# Patient Record
Sex: Female | Born: 1975 | Race: White | Hispanic: No | Marital: Married | Smoking: Former smoker
Health system: Southern US, Community
[De-identification: ages and names within clinical notes are randomized; demographics above are authoritative.]

## PROBLEM LIST (undated history)

## (undated) DIAGNOSIS — R87619 Unspecified abnormal cytological findings in specimens from cervix uteri: Secondary | ICD-10-CM

## (undated) DIAGNOSIS — IMO0002 Reserved for concepts with insufficient information to code with codable children: Secondary | ICD-10-CM

## (undated) HISTORY — PX: WISDOM TOOTH EXTRACTION: SHX21

## (undated) HISTORY — DX: Unspecified abnormal cytological findings in specimens from cervix uteri: R87.619

## (undated) HISTORY — DX: Reserved for concepts with insufficient information to code with codable children: IMO0002

---

## 2000-02-11 ENCOUNTER — Other Ambulatory Visit: Admission: RE | Admit: 2000-02-11 | Discharge: 2000-02-11 | Payer: Self-pay | Admitting: Obstetrics

## 2011-12-02 ENCOUNTER — Ambulatory Visit (HOSPITAL_COMMUNITY)
Admission: RE | Admit: 2011-12-02 | Discharge: 2011-12-02 | Disposition: A | Discharge status: Home / Self Care | Payer: BC Managed Care – PPO | Source: Ambulatory Visit | Attending: Obstetrics | Admitting: Obstetrics

## 2011-12-02 NOTE — Progress Notes (Addendum)
Infant Lactation Consultation Outpatient Visit Note  Patient Name: Sabrina Meyers & [name] Date of Birth: [birth year]  Baby born :  [birth year] Birth Weight:  6-3  At 2 weeks at [facility] wt was 6-12  At consult at [facility] on 11/14/11 weight was 6-15 Gestational Age at Delivery: 39 weeks Type of Delivery: Cesarean Section  Breastfeeding History Frequency of Breastfeeding: During day have to wake by 3 hours and wakes up fussy every 4 hours during night Length of Feeding:  10 to 30 minutes ( if poor latch and 30 if good latch)  Voids: 6 plus  Stools: 8 plus yellow   Supplementing / Method: Pumping:  Type of Pump:  Has not been pumping   Frequency:  Volume:    Comments:    Consultation Evaluation:  Mom has thrush which is being treated with Diflucan & Nystatin.  Nipples and areola are red.  Mom says no longer having severe pain with latch, but sometimes [name] does not latch well or maintain the latch on the left breast.  Mom is leaking as soon as [name] starts crying.  She latches well and begins good rhythmic sucking and swallowing.  In less than 5 minutes into the feeding on the left breast, she starts arching, gagging and choking.  That's when she starts sucking in lower lip and loosing good suction and seal on the breast.  Mom concerned because [name] is only staying on the breast for 10 minutes and then wanting to BF again a few minutes later.  She is very gassy and fussy for long periods during the day and it seems like all night last night.  Sometimes the stools are green.      Initial Feeding Assessment:  Nude except for dry pamper Pre-feed Weight  :3814gms (8-6.5)   Post-feed Weight:  3889gms (8-9.1) Amount Transferred: 75ml Comments:    Additional Feeding Assessment:  Fully clothed Pre-feed Weight:  3949 Post-feed Weight: 3954  Amount Transferred:  39ml Comments:  Additional Feeding Assessment: Pre-feed Weight: Post-feed Weight: Amount  Transferred: Comments:  Total Breast milk Transferred this Visit:   Total Supplement Given: None needed  Additional Interventions:  Problem seems to be overactive let down.  Will try to breastfeed on 1 breast at a feeding.  Will break suction when begins to arch & push away when milk lets down, take off & burp.  Put back to same breast letting her feed for at least 20 minutes assuring sufficient amount of hindmilk.  This should decrease the stimulation helping with overactive let down.  Once baby is latched on with good deep latch, lean back for rest of feeding to prevent milk shooting so forcefully when let down occurs.     Follow-Up:  Call if needed.  Will call with update Monday.      Louis Meckel 12/02/2011, 9:06 AM

## 2011-12-12 ENCOUNTER — Ambulatory Visit (HOSPITAL_COMMUNITY)
Admission: RE | Admit: 2011-12-12 | Discharge: 2011-12-12 | Disposition: A | Discharge status: Home / Self Care | Payer: BC Managed Care – PPO | Source: Ambulatory Visit | Attending: Obstetrics | Admitting: Obstetrics

## 2011-12-12 NOTE — Progress Notes (Addendum)
Adult Lactation Consultation Outpatient Visit Note  Patient Name: Sabrina Meyers  Infant- Nilsa Nutting Date of Birth: 1976-07-08               DOB-[birth year] Gestational Age at Delivery: 39 weeks Type of Delivery:  Cesarean  [name] comes in today with complaint of sore nipples with latch.  Baby is 37 weeks old.  [name] just complete her course of diflucan and baby nystatin suspension for yeast on her nipples.  She said the burning, stinging nipples are better, but she is still experiencing pain with latch.  She wanted help latching [name] more deeply.  Baby has gained 60.2 oz in 43 days.  Milk supply very good.  Mom fed baby in cross cradle, and football holds.  [name] grunts and moves around a lot even during feedings.  When baby pushes off the breast, I have Mom take baby completely off and re-latch.  Had [name] practice over and over again, latching baby.  She stated that she could really tell the difference when [name] was latched after she had opened widest.  She needs practice latching quickly.  Talked about baby's fussiness, and grunting.  Stools yellow, but not a lot of seeds per Mom.  Suggested she feed baby on the same breast for 2 feedings in a row (if within 2-3 hrs), or pre-pumping morning feeding when she is so full.  This would ensure more hind milk at a feeding which is less gas producing.  Also to try eliminating all dairy from her diet for a week.  Talked about growth spurts at 6 weeks, and the best way to handle it, is feed on breast whenever she gives a cue she is hungry.  Also told her about APNO by Dr. Marshell Levan.  (all purpose nipple ointment) Comfort Gels given To call us prn   Judee Clara 12/12/2011, 5:09 PM

## 2013-01-17 ENCOUNTER — Encounter: Payer: BC Managed Care – PPO | Admitting: Obstetrics & Gynecology

## 2013-01-23 ENCOUNTER — Ambulatory Visit (INDEPENDENT_AMBULATORY_CARE_PROVIDER_SITE_OTHER): Payer: BC Managed Care – PPO | Admitting: Advanced Practice Midwife

## 2013-01-23 ENCOUNTER — Encounter: Payer: Self-pay | Admitting: Advanced Practice Midwife

## 2013-01-23 VITALS — BP 115/67 | HR 55 | Resp 16 | Ht 62.25 in | Wt 119.0 lb

## 2013-01-23 DIAGNOSIS — Z01419 Encounter for gynecological examination (general) (routine) without abnormal findings: Secondary | ICD-10-CM

## 2013-01-23 NOTE — Patient Instructions (Signed)
Breast Self-Awareness  Practicing breast self-awareness may pick up problems early, prevent significant medical complications, and possibly save your life. By practicing breast self-awareness, you can become familiar with how your breasts look and feel and if your breasts are changing. This allows you to notice changes early. It can also offer you some reassurance that your breast health is good. One way to learn what is normal for your breasts and whether your breasts are changing is to do a breast self-exam.  If you find a lump or something that was not present in the past, it is best to contact your caregiver right away. Other findings that should be evaluated by your caregiver include nipple discharge, especially if it is bloody; skin changes or reddening; areas where the skin seems to be pulled in (retracted); or new lumps and bumps. Breast pain is seldom associated with cancer (malignancy), but should also be evaluated by a caregiver.  BREAST SELF-EXAM  The best time to examine your breasts is 5 7 days after your menstrual period is over. During menstruation, the breasts are lumpier, and it may be more difficult to pick up changes. If you do not menstruate, have reached menopause, or had your uterus removed (hysterectomy), you should examine your breasts at regular intervals, such as monthly. If you are breastfeeding, examine your breasts after a feeding or after using a breast pump. Breast implants do not decrease the risk for lumps or tumors, so continue to perform breast self-exams as recommended. Talk to your caregiver about how to determine the difference between the implant and breast tissue. Also, talk about the amount of pressure you should use during the exam. Over time, you will become more familiar with the variations of your breasts and more comfortable with the exam. A breast self-exam requires you to remove all your clothes above the waist.    Look at your breasts and nipples. Stand in front of  a mirror in a room with good lighting. With your hands on your hips, push your hands firmly downward. Look for a difference in shape, contour, and size from one breast to the other (asymmetry). Asymmetry includes puckers, dips, or bumps. Also, look for skin changes, such as reddened or scaly areas on the breasts. Look for nipple changes, such as discharge, dimpling, repositioning, or redness.   Carefully feel your breasts. This is best done either in the shower or tub while using soapy water or when flat on your back. Place the arm (on the side of the breast you are examining) above your head. Use the pads (not the fingertips) of your three middle fingers on your opposite hand to feel your breasts. Start in the underarm area and use  inch (2 cm) overlapping circles to feel your breast. Use 3 different levels of pressure (light, medium, and firm pressure) at each circle before moving to the next circle. The light pressure is needed to feel the tissue closest to the skin. The medium pressure will help to feel breast tissue a little deeper, while the firm pressure is needed to feel the tissue close to the ribs. Continue the overlapping circles, moving downward over the breast until you feel your ribs below your breast. Then, move one finger-width towards the center of the body. Continue to use the  inch (2 cm) overlapping circles to feel your breast as you move slowly up toward the collar bone (clavicle) near the base of the neck. Continue the up and down exam using all 3 pressures   until you reach the middle of the chest. Do this with each breast, carefully feeling for lumps or changes.   Keep a written record with breast changes or normal findings for each breast. By writing this information down, you do not need to depend only on memory for size, tenderness, or location. Write down where you are in your menstrual cycle, if you are still menstruating.   Breast tissue can have some lumps or thick tissue. However,  see your caregiver if you find anything that concerns you.   SEEK MEDICAL CARE IF:   You see a change in shape, contour, or size of your breasts or nipples.    You see skin changes, such as reddened or scaly areas on the breasts or nipples.    You have an unusual discharge from your nipples.    You feel a new lump or unusually thick areas.   Document Released: 09/12/2005 Document Revised: 03/13/2012 Document Reviewed: 12/28/2011  Exit[employer] Patient Information 2013 [employer].

## 2013-01-23 NOTE — Progress Notes (Signed)
Subjective:     Sabrina Meyers is a 37 y.o. female here for a routine exam.  Current complaints: none.  Personal health questionnaire reviewed: yes.   Gynecologic History Patient's last menstrual period was 01/17/2013. Contraception: IUD and paragard Last Pap: 2013. Results were: normal, abnormal pap 10+ years ago, all normal since Last mammogram: n/a.   Obstetric History OB History   Grav Para Term Preterm Abortions TAB SAB Ect Mult Living   2 2 2       2      # Outc Date GA Lbr Len/2nd Wgt Sex Del Anes PTL Lv   1 TRM      CS      2 TRM      CS          The following portions of the patient's history were reviewed and updated as appropriate: allergies, current medications, past family history, past medical history, past social history, past surgical history and problem list.  Review of Systems A comprehensive review of systems was negative.    Objective:    BP 115/67  Pulse 55  Resp 16  Ht 5' 2.25" (1.581 m)  Wt 119 lb (53.978 kg)  BMI 21.59 kg/m2  LMP 01/17/2013  General Appearance:    Alert, cooperative, no distress, appears stated age  Head:    Normocephalic, without obvious abnormality, atraumatic              Neck:   Supple, symmetrical, trachea midline, no adenopathy;    thyroid:  no enlargement/tenderness/nodules; no carotid   bruit or JVD        Chest Wall:    No tenderness or deformity     Breast Exam:    No tenderness, masses, or nipple abnormality  Abdomen:     Soft, non-tender, bowel sounds active all four quadrants,    no masses, no organomegaly  Genitalia:    Normal female without lesion, discharge or tenderness     Extremities:   Extremities normal, atraumatic, no cyanosis or edema     Skin:   Skin color, texture, turgor normal, no rashes or lesions  Neuro: A&O x 4         Assessment:    Healthy female exam.    Plan:    Education reviewed: self breast exams. Contraception: IUD. Follow up in: 1 year.   Pap up to date - next due  2016

## 2013-08-01 ENCOUNTER — Other Ambulatory Visit: Payer: Self-pay

## 2013-08-27 ENCOUNTER — Ambulatory Visit: Payer: BC Managed Care – PPO | Admitting: Obstetrics & Gynecology

## 2013-08-28 ENCOUNTER — Ambulatory Visit (INDEPENDENT_AMBULATORY_CARE_PROVIDER_SITE_OTHER): Payer: BC Managed Care – PPO | Admitting: Obstetrics & Gynecology

## 2013-08-28 ENCOUNTER — Encounter: Payer: Self-pay | Admitting: Obstetrics & Gynecology

## 2013-08-28 VITALS — BP 131/65 | HR 59 | Resp 16 | Ht 62.5 in | Wt 126.0 lb

## 2013-08-28 DIAGNOSIS — Z30432 Encounter for removal of intrauterine contraceptive device: Secondary | ICD-10-CM

## 2013-08-28 DIAGNOSIS — Z3169 Encounter for other general counseling and advice on procreation: Secondary | ICD-10-CM

## 2013-08-28 NOTE — Progress Notes (Signed)
   Subjective:    Patient ID: Sabrina Meyers, female    DOB: Nov 21, 1975, 37 y.o.   MRN: [id number]  HPI  Sabrina Meyers is a 37 yo MW P2 who is here to have her 37 yo Copper T IUD removed because she would like to be pregnant. She is taking PNVs and denies a FH of MR, birth defects, CF or other hereditary issues.  Review of Systems She has had 2 previous cesarean sections and would like to have a TOLAC. I have discussed the risks and said that the decision is hers.    Objective:   Physical Exam  Her Copper T IUD was easily removed and noted to be intact.      Assessment & Plan:   Desire for pregnancy- All questions answered. RTC prn

## 2013-09-26 NOTE — L&D Delivery Note (Signed)
Attestation of Attending Supervision of Advanced Practitioner (CNM/NP): Evaluation and management procedures were performed by the Advanced Practitioner under my supervision and collaboration.  I have reviewed the Advanced Practitioner's note and chart, and I agree with the management and plan.  Lucilla Petrenko 06/29/2014 9:20 AM   

## 2013-09-26 NOTE — L&D Delivery Note (Signed)
Delivery Note At 3:24 PM a viable and healthy female was delivered via Vaginal, Spontaneous Delivery (Presentation: Right Occiput Anterior).  Loose nuchal reduced easily.  APGAR: 8, 9; weight pending.   Placenta status: Intact, Spontaneous.  Cord: 3 vessels with the following complications: None.  Cord pH: n/a  Anesthesia: Epidural  Episiotomy: None Lacerations: 2nd degree (Dr. Jolayne Pantheronstant in to assess laceration); capusle intact Suture Repair: 3 monocryl x 3; repaired right sulcus and 2nd degree perineal laceration. Est. Blood Loss (mL): 350  Mom to postpartum.  Baby to Couplet care / Skin to Skin.  Rochele PagesKARIM, WALIDAH N 06/24/2014, 4:29 PM

## 2013-10-28 ENCOUNTER — Encounter: Payer: Self-pay | Admitting: Advanced Practice Midwife

## 2013-10-28 ENCOUNTER — Ambulatory Visit (INDEPENDENT_AMBULATORY_CARE_PROVIDER_SITE_OTHER): Payer: 59 | Admitting: Advanced Practice Midwife

## 2013-10-28 VITALS — BP 119/70 | Wt 121.0 lb

## 2013-10-28 DIAGNOSIS — Z9889 Other specified postprocedural states: Secondary | ICD-10-CM

## 2013-10-28 DIAGNOSIS — O480 Post-term pregnancy: Secondary | ICD-10-CM | POA: Insufficient documentation

## 2013-10-28 DIAGNOSIS — O09529 Supervision of elderly multigravida, unspecified trimester: Secondary | ICD-10-CM | POA: Insufficient documentation

## 2013-10-28 DIAGNOSIS — Z98891 History of uterine scar from previous surgery: Secondary | ICD-10-CM | POA: Insufficient documentation

## 2013-10-28 DIAGNOSIS — O219 Vomiting of pregnancy, unspecified: Secondary | ICD-10-CM

## 2013-10-28 DIAGNOSIS — O26859 Spotting complicating pregnancy, unspecified trimester: Secondary | ICD-10-CM | POA: Insufficient documentation

## 2013-10-28 DIAGNOSIS — Z3491 Encounter for supervision of normal pregnancy, unspecified, first trimester: Secondary | ICD-10-CM

## 2013-10-28 DIAGNOSIS — Z3480 Encounter for supervision of other normal pregnancy, unspecified trimester: Secondary | ICD-10-CM

## 2013-10-28 LAB — HIV ANTIBODY (ROUTINE TESTING W REFLEX): HIV: NONREACTIVE

## 2013-10-28 MED ORDER — DOXYLAMINE-PYRIDOXINE 10-10 MG PO TBEC: 1.0000 | DELAYED_RELEASE_TABLET | ORAL | Status: DC

## 2013-10-28 NOTE — Progress Notes (Signed)
p-79  Last pap post partum   Small amount of spotting over the weekend with no further c/o's

## 2013-10-28 NOTE — Progress Notes (Signed)
  Subjective:    Sabrina Meyers is being seen today for her first obstetrical visit.  This is a planned pregnancy. She is at 4530w3d gestation. Her obstetrical history is significant for advanced maternal age and previous CS x 2. FTP, repeat. . Relationship with FOB: spouse, living together. Patient does intend to breast feed. Pregnancy history fully reviewed.     Review of Systems:   Review of Systems: Patient reports spotting 2 days ago. None now. No recent IC. No abd pain or passage of tissue. Nausea, rare vomiting. Otherwise negative.  Objective:     BP 119/70  Wt 121 lb (54.885 kg)  LMP 08/30/2013 Physical Exam  Exam Physical Examination: General appearance - alert, well appearing, and in no distress, oriented to person, place, and time and well hydrated Mental status - alert, oriented to person, place, and time, normal mood, behavior, speech, dress, motor activity, and thought processes Eyes - sclera anicteric Mouth - mucous membranes moist, pharynx normal without lesions and dental hygiene good Neck - supple, no significant adenopathy, thyroid exam: thyroid is normal in size without nodules or tenderness Heart - normal rate, regular rhythm, normal S1, S2, no murmurs, rubs, clicks or gallops Abdomen - soft, nontender, nondistended, no masses or organomegaly. Well-healed LTC/S scar Breasts - breasts appear normal, no suspicious masses, no skin or nipple changes or axillary nodes Pelvic -  Deferred. Will send to [facility]U.  Back exam - no CVAT Neurological - alert, oriented, normal speech, no focal findings or movement disorder noted Musculoskeletal - no joint tenderness, deformity or swelling Extremities - no pedal edema noted. Skin - normal coloration and turgor, no rashes, no suspicious skin lesions noted Positive cardiac activity per informal BS US.     Assessment:    Pregnancy: [id number] Patient Active Problem List   Diagnosis Date Noted  . History of cesarean section, low  transverse 10/28/2013  Supervision of normal pregnancy. First trimester spotting  Rh neg   Plan:    To MAU for bleeding eval, rhophylac.  Initial labs drawn. Prenatal vitamins. Problem list reviewed and updated. AFP3 discussed: declined. Role of ultrasound in pregnancy discussed; fetal survey: requested. Amniocentesis discussed: declined. Follow up in 4 weeks. 75% of 30 min visit spent on counseling and coordination of care.  Desires TOLAC. Increased risk of uterine rupture discussed. Will not induce. Low threshold for repeat C/S Pt verbalizes understanding and is agreeable.    Dorathy KinsmanSMITH, Sabrina Meyers, CNM 10/28/2013

## 2013-10-28 NOTE — Patient Instructions (Signed)
Vaginal Birth After Cesarean Delivery Vaginal birth after cesarean delivery (VBAC) is giving birth vaginally after previously delivering a baby by a cesarean. In the past, if a woman had a cesarean delivery, all births afterwards would be done by cesarean delivery. This is no longer true. It can be safe for the mother to try a vaginal delivery after having a cesarean delivery.  It is important to discuss VBAC with your health care provider early in the pregnancy so you can understand the risks, benefits, and options. It will give you time to decide what is best in your particular case. The final decision about whether to have a VBAC or repeat cesarean delivery should be between you and your health care provider. Any changes in your health or your baby's health during your pregnancy may make it necessary to change your initial decision about VBAC.  WOMEN WHO PLAN TO HAVE A VBAC SHOULD CHECK WITH THEIR HEALTH CARE PROVIDER TO BE SURE THAT:  The previous cesarean delivery was done with a low transverse uterine cut (incision) (not a vertical classical incision).   The birth canal is big enough for the baby.   There were no other operations on the uterus.   An electronic fetal monitor (EFM) will be on at all times during labor.   An operating room will be available and ready in case an emergency cesarean delivery is needed.   A health care provider and surgical nursing staff will be available at all times during labor to be ready to do an emergency delivery cesarean if necessary.   An anesthesiologist will be present in case an emergency cesarean delivery is needed.   The nursery is prepared and has adequate personnel and necessary equipment available to care for the baby in case of an emergency cesarean delivery. BENEFITS OF VBAC  Shorter stay in the hospital.   Avoidance of risks associated with cesarean delivery, such as:  Surgical complications, such as opening of the incision or  hernia in the incision.  Injury to other organs.  Fever. This can occur if an infection develops after surgery. It can also occur as a reaction to the medicine given to make you numb during the surgery.  Less blood loss and need for blood transfusions.  Lower risk of blood clots and infection.  Shorter recovery.   Decreased risk for having to remove the uterus (hysterectomy).   Decreased risk for the placenta to completely or partially cover the opening of the uterus (placenta previa) with a future pregnancy.   Decrease risk in future labor and delivery. RISKS OF A VBAC  Tearing (rupture) of the uterus. This is occurs in less than 1% of VBACs. The risk of this happening is higher if:  Steps are taken to begin the labor process (induce labor) or stimulate or strengthen contractions (augment labor).   Medicine is used to soften (ripen) the cervix.  Having to remove the uterus (hysterectomy) if it ruptures. VBAC SHOULD NOT BE DONE IF:  The previous cesarean delivery was done with a vertical (classical) or T-shaped incision or you do not know what kind of incision was made.   You had a ruptured uterus.   You have had certain types of surgery on your uterus, such as removal of uterine fibroids. Ask your health care provider about other types of surgeries that prevent you from having a VBAC.  You have certain medical or childbirth (obstetrical) problems.   There are problems with the baby.   You   have had two previous cesarean deliveries and no vaginal deliveries. OTHER FACTS TO KNOW ABOUT VBAC:  It is safe to have an epidural anesthetic with VBAC.   It is safe to turn the baby from a breech position (attempt an external cephalic version).   It is safe to try a VBAC with twins.   VBAC may not be successful if your baby weights 8.8 lb (4 kg) or more. However, weight predictions are not always accurate and should not be used alone to decide if VBAC is right for  you.  There is an increased failure rate if the time between the cesarean delivery and VBAC is less than 19 months.   Your health care provider may advise against a VBAC if you have preeclampsia (high blood pressure, protein in the urine, and swelling of face and extremities).   VBAC is often successful if you previously gave birth vaginally.   VBAC is often successful when the labor starts spontaneously before the due date.   Delivering a baby through a VBAC is similar to having a normal spontaneous vaginal delivery. Document Released: 03/05/2007 Document Revised: 07/03/2013 Document Reviewed: 04/11/2013 [facility] Patient Information 2014 [place], [place].   Vaginal Bleeding During Pregnancy, First Trimester A small amount of bleeding (spotting) from the vagina is relatively common in early pregnancy. It usually stops on its own. Various things may cause bleeding or spotting in early pregnancy. Some bleeding may be related to the pregnancy, and some may not. In most cases, the bleeding is normal and is not a problem. However, bleeding can also be a sign of something serious. Be sure to tell your health care provider about any vaginal bleeding right away. Some possible causes of vaginal bleeding during the first trimester include:  Infection or inflammation of the cervix.  Growths (polyps) on the cervix.  Miscarriage or threatened miscarriage.  Pregnancy tissue has developed outside of the uterus and in a fallopian tube (tubal pregnancy).  Tiny cysts have developed in the uterus instead of pregnancy tissue (molar pregnancy). HOME CARE INSTRUCTIONS  Watch your condition for any changes. The following actions may help to lessen any discomfort you are feeling:  Follow your health care provider's instructions for limiting your activity. If your health care provider orders bed rest, you may need to stay in bed and only get up to use the bathroom. However, your health care provider may  allow you to continue light activity.  If needed, make plans for someone to help with your regular activities and responsibilities while you are on bed rest.  Keep track of the number of pads you use each day, how often you change pads, and how soaked (saturated) they are. Write this down.  Do not use tampons. Do not douche.  Do not have sexual intercourse or orgasms until approved by your health care provider.  If you pass any tissue from your vagina, save the tissue so you can show it to your health care provider.  Only take over-the-counter or prescription medicines as directed by your health care provider.  Do not take aspirin because it can make you bleed.  Keep all follow-up appointments as directed by your health care provider. SEEK MEDICAL CARE IF:  You have any vaginal bleeding during any part of your pregnancy.  You have cramps or labor pains. SEEK IMMEDIATE MEDICAL CARE IF:   You have severe cramps in your back or belly (abdomen).  You have a fever, not controlled by medicine.  You pass large  clots or tissue from your vagina.  Your bleeding increases.  You feel lightheaded or weak, or you have fainting episodes.  You have chills.  You are leaking fluid or have a gush of fluid from your vagina.  You pass out while having a bowel movement. MAKE SURE YOU:  Understand these instructions.  Will watch your condition.  Will get help right away if you are not doing well or get worse. Document Released: 06/22/2005 Document Revised: 07/03/2013 Document Reviewed: 05/20/2013 [facility] Patient Information 2014 [place], [place].

## 2013-10-29 ENCOUNTER — Ambulatory Visit (INDEPENDENT_AMBULATORY_CARE_PROVIDER_SITE_OTHER): Payer: 59

## 2013-10-29 ENCOUNTER — Ambulatory Visit (INDEPENDENT_AMBULATORY_CARE_PROVIDER_SITE_OTHER): Payer: 59 | Admitting: *Deleted

## 2013-10-29 ENCOUNTER — Other Ambulatory Visit: Payer: 59

## 2013-10-29 ENCOUNTER — Other Ambulatory Visit: Payer: Self-pay | Admitting: *Deleted

## 2013-10-29 DIAGNOSIS — Z6791 Unspecified blood type, Rh negative: Secondary | ICD-10-CM

## 2013-10-29 DIAGNOSIS — Z331 Pregnant state, incidental: Secondary | ICD-10-CM

## 2013-10-29 DIAGNOSIS — O26859 Spotting complicating pregnancy, unspecified trimester: Secondary | ICD-10-CM

## 2013-10-29 DIAGNOSIS — O9989 Other specified diseases and conditions complicating pregnancy, childbirth and the puerperium: Secondary | ICD-10-CM

## 2013-10-29 DIAGNOSIS — R52 Pain, unspecified: Secondary | ICD-10-CM

## 2013-10-29 DIAGNOSIS — N949 Unspecified condition associated with female genital organs and menstrual cycle: Secondary | ICD-10-CM

## 2013-10-29 DIAGNOSIS — O361190 Maternal care for Anti-A sensitization, unspecified trimester, not applicable or unspecified: Secondary | ICD-10-CM

## 2013-10-29 DIAGNOSIS — O99891 Other specified diseases and conditions complicating pregnancy: Secondary | ICD-10-CM

## 2013-10-29 DIAGNOSIS — N898 Other specified noninflammatory disorders of vagina: Secondary | ICD-10-CM

## 2013-10-29 LAB — OBSTETRIC PANEL
ANTIBODY SCREEN: NEGATIVE
BASOS ABS: 0 10*3/uL (ref 0.0–0.1)
BASOS PCT: 1 % (ref 0–1)
EOS ABS: 0.1 10*3/uL (ref 0.0–0.7)
EOS PCT: 2 % (ref 0–5)
HEMATOCRIT: 37.6 % (ref 36.0–46.0)
HEMOGLOBIN: 13.1 g/dL (ref 12.0–15.0)
Hepatitis B Surface Ag: NEGATIVE
Lymphocytes Relative: 23 % (ref 12–46)
Lymphs Abs: 1.3 10*3/uL (ref 0.7–4.0)
MCH: 31.3 pg (ref 26.0–34.0)
MCHC: 34.8 g/dL (ref 30.0–36.0)
MCV: 90 fL (ref 78.0–100.0)
MONO ABS: 0.5 10*3/uL (ref 0.1–1.0)
MONOS PCT: 9 % (ref 3–12)
Neutro Abs: 3.9 10*3/uL (ref 1.7–7.7)
Neutrophils Relative %: 65 % (ref 43–77)
Platelets: 207 10*3/uL (ref 150–400)
RBC: 4.18 MIL/uL (ref 3.87–5.11)
RDW: 13 % (ref 11.5–15.5)
RH TYPE: NEGATIVE
Rubella: 1.23 Index — ABNORMAL HIGH (ref <0.90)
WBC: 5.9 10*3/uL (ref 4.0–10.5)

## 2013-10-29 LAB — GC/CHLAMYDIA PROBE AMP, URINE
Chlamydia, Swab/Urine, PCR: NEGATIVE
GC PROBE AMP, URINE: NEGATIVE

## 2013-10-29 MED ORDER — RHO D IMMUNE GLOBULIN 1500 UNIT/2ML IJ SOLN
300.0000 ug | Freq: Once | INTRAMUSCULAR | Status: AC
  Administered 2013-10-29: 300 ug via INTRAMUSCULAR

## 2013-10-30 LAB — CULTURE, OB URINE
COLONY COUNT: NO GROWTH
Organism ID, Bacteria: NO GROWTH

## 2013-10-31 ENCOUNTER — Encounter: Payer: Self-pay | Admitting: Advanced Practice Midwife

## 2013-10-31 DIAGNOSIS — O26891 Other specified pregnancy related conditions, first trimester: Secondary | ICD-10-CM | POA: Insufficient documentation

## 2013-10-31 DIAGNOSIS — Z6791 Unspecified blood type, Rh negative: Secondary | ICD-10-CM | POA: Insufficient documentation

## 2013-11-06 ENCOUNTER — Telehealth: Payer: Self-pay | Admitting: *Deleted

## 2013-11-06 DIAGNOSIS — O219 Vomiting of pregnancy, unspecified: Secondary | ICD-10-CM

## 2013-11-06 MED ORDER — PROMETHAZINE HCL 25 MG PO TABS: 25.0000 mg | ORAL_TABLET | Freq: Four times a day (QID) | ORAL | Status: DC | PRN

## 2013-11-06 NOTE — Telephone Encounter (Signed)
Pt's insurance declined coverage for Diglegis and pt requesting something else called in.  RX for Phenergan 25 mg sent to [facility] pharmacy.

## 2013-11-25 ENCOUNTER — Encounter: Payer: Self-pay | Admitting: Advanced Practice Midwife

## 2013-11-25 ENCOUNTER — Ambulatory Visit (INDEPENDENT_AMBULATORY_CARE_PROVIDER_SITE_OTHER): Payer: 59 | Admitting: Advanced Practice Midwife

## 2013-11-25 VITALS — BP 114/64 | Wt 125.0 lb

## 2013-11-25 DIAGNOSIS — Z3480 Encounter for supervision of other normal pregnancy, unspecified trimester: Secondary | ICD-10-CM

## 2013-11-25 DIAGNOSIS — Z9889 Other specified postprocedural states: Secondary | ICD-10-CM

## 2013-11-25 DIAGNOSIS — Z98891 History of uterine scar from previous surgery: Secondary | ICD-10-CM

## 2013-11-25 DIAGNOSIS — Z3491 Encounter for supervision of normal pregnancy, unspecified, first trimester: Secondary | ICD-10-CM

## 2013-11-25 NOTE — Patient Instructions (Addendum)
Advanced Maternal Age 36.60  Breastfeeding Deciding to breastfeed is one of the best choices you can make for you and your baby. A change in hormones during pregnancy causes your breast tissue to grow and increases the number and size of your milk ducts. These hormones also allow proteins, sugars, and fats from your blood supply to make breast milk in your milk-producing glands. Hormones prevent breast milk from being released before your baby is born as well as prompt milk flow after birth. Once breastfeeding has begun, thoughts of your baby, as well as his or her sucking or crying, can stimulate the release of milk from your milk-producing glands.  BENEFITS OF BREASTFEEDING For Your Baby  Your first milk (colostrum) helps your baby's digestive system function better.   There are antibodies in your milk that help your baby fight off infections.   Your baby has a lower incidence of asthma, allergies, and sudden infant death syndrome.   The nutrients in breast milk are better for your baby than infant formulas and are designed uniquely for your baby's needs.   Breast milk improves your baby's brain development.   Your baby is less likely to develop other conditions, such as childhood obesity, asthma, or type 2 diabetes mellitus.  For You   Breastfeeding helps to create a very special bond between you and your baby.   Breastfeeding is convenient. Breast milk is always available at the correct temperature and costs nothing.   Breastfeeding helps to burn calories and helps you lose the weight gained during pregnancy.   Breastfeeding makes your uterus contract to its prepregnancy size faster and slows bleeding (lochia) after you give birth.   Breastfeeding helps to lower your risk of developing type 2 diabetes mellitus, osteoporosis, and breast or ovarian cancer later in life. SIGNS THAT YOUR BABY IS HUNGRY Early Signs of Hunger  Increased alertness or  activity.  Stretching.  Movement of the head from side to side.  Movement of the head and opening of the mouth when the corner of the mouth or cheek is stroked (rooting).  Increased sucking sounds, smacking lips, cooing, sighing, or squeaking.  Hand-to-mouth movements.  Increased sucking of fingers or hands. Late Signs of Hunger  Fussing.  Intermittent crying. Extreme Signs of Hunger Signs of extreme hunger will require calming and consoling before your baby will be able to breastfeed successfully. Do not wait for the following signs of extreme hunger to occur before you initiate breastfeeding:   Restlessness.  A loud, strong cry.   Screaming. BREASTFEEDING BASICS Breastfeeding Initiation  Find a comfortable place to sit or lie down, with your neck and back well supported.  Place a pillow or rolled up blanket under your baby to bring him or her to the level of your breast (if you are seated). Nursing pillows are specially designed to help support your arms and your baby while you breastfeed.  Make sure that your baby's abdomen is facing your abdomen.   Gently massage your breast. With your fingertips, massage from your chest wall toward your nipple in a circular motion. This encourages milk flow. You may need to continue this action during the feeding if your milk flows slowly.  Support your breast with 4 fingers underneath and your thumb above your nipple. Make sure your fingers are well away from your nipple and your baby's mouth.   Stroke your baby's lips gently with your finger or nipple.   When your baby's mouth is open wide enough, quickly  bring your baby to your breast, placing your entire nipple and as much of the colored area around your nipple (areola) as possible into your baby's mouth.   More areola should be visible above your baby's upper lip than below the lower lip.   Your baby's tongue should be between his or her lower gum and your breast.    Ensure that your baby's mouth is correctly positioned around your nipple (latched). Your baby's lips should create a seal on your breast and be turned out (everted).  It is common for your baby to suck about 2 3 minutes in order to start the flow of breast milk. Latching Teaching your baby how to latch on to your breast properly is very important. An improper latch can cause nipple pain and decreased milk supply for you and poor weight gain in your baby. Also, if your baby is not latched onto your nipple properly, he or she may swallow some air during feeding. This can make your baby fussy. Burping your baby when you switch breasts during the feeding can help to get rid of the air. However, teaching your baby to latch on properly is still the best way to prevent fussiness from swallowing air while breastfeeding. Signs that your baby has successfully latched on to your nipple:    Silent tugging or silent sucking, without causing you pain.   Swallowing heard between every 3 4 sucks.    Muscle movement above and in front of his or her ears while sucking.  Signs that your baby has not successfully latched on to nipple:   Sucking sounds or smacking sounds from your baby while breastfeeding.  Nipple pain. If you think your baby has not latched on correctly, slip your finger into the corner of your baby's mouth to break the suction and place it between your baby's gums. Attempt breastfeeding initiation again. Signs of Successful Breastfeeding Signs from your baby:   A gradual decrease in the number of sucks or complete cessation of sucking.   Falling asleep.   Relaxation of his or her body.   Retention of a small amount of milk in his or her mouth.   Letting go of your breast by himself or herself. Signs from you:  Breasts that have increased in firmness, weight, and size 1 3 hours after feeding.   Breasts that are softer immediately after breastfeeding.  Increased milk  volume, as well as a change in milk consistency and color by the 5th day of breastfeeding.   Nipples that are not sore, cracked, or bleeding. Signs That Your Pecola LeisureBaby is Getting Enough Milk  Wetting at least 3 diapers in a 24-hour period. The urine should be clear and pale yellow by age 26 days.  At least 3 stools in a 24-hour period by age 26 days. The stool should be soft and yellow.  At least 3 stools in a 24-hour period by age 39 days. The stool should be seedy and yellow.  No loss of weight greater than 10% of birth weight during the first 83 days of age.  Average weight gain of 4 7 ounces (120 210 mL) per week after age 2 days.  Consistent daily weight gain by age 26 days, without weight loss after the age of 2 weeks. After a feeding, your baby may spit up a small amount. This is common. BREASTFEEDING FREQUENCY AND DURATION Frequent feeding will help you make more milk and can prevent sore nipples and breast engorgement. Breastfeed when you  feel the need to reduce the fullness of your breasts or when your baby shows signs of hunger. This is called "breastfeeding on demand." Avoid introducing a pacifier to your baby while you are working to establish breastfeeding (the first 4 6 weeks after your baby is born). After this time you may choose to use a pacifier. Research has shown that pacifier use during the first year of a baby's life decreases the risk of sudden infant death syndrome (SIDS). Allow your baby to feed on each breast as long as he or she wants. Breastfeed until your baby is finished feeding. When your baby unlatches or falls asleep while feeding from the first breast, offer the second breast. Because newborns are often sleepy in the first few weeks of life, you may need to awaken your baby to get him or her to feed. Breastfeeding times will vary from baby to baby. However, the following rules can serve as a guide to help you ensure that your baby is properly fed:  Newborns (babies 58  weeks of age or younger) may breastfeed every 1 3 hours.  Newborns should not go longer than 3 hours during the day or 5 hours during the night without breastfeeding.  You should breastfeed your baby a minimum of 8 times in a 24-hour period until you begin to introduce solid foods to your baby at around 41 months of age. BREAST MILK PUMPING Pumping and storing breast milk allows you to ensure that your baby is exclusively fed your breast milk, even at times when you are unable to breastfeed. This is especially important if you are going back to work while you are still breastfeeding or when you are not able to be present during feedings. Your lactation consultant can give you guidelines on how long it is safe to store breast milk.  A breast pump is a machine that allows you to pump milk from your breast into a sterile bottle. The pumped breast milk can then be stored in a refrigerator or freezer. Some breast pumps are operated by hand, while others use electricity. Ask your lactation consultant which type will work best for you. Breast pumps can be purchased, but some hospitals and breastfeeding support groups lease breast pumps on a monthly basis. A lactation consultant can teach you how to hand express breast milk, if you prefer not to use a pump.  CARING FOR YOUR BREASTS WHILE YOU BREASTFEED Nipples can become dry, cracked, and sore while breastfeeding. The following recommendations can help keep your breasts moisturized and healthy:  Avoid using soap on your nipples.   Wear a supportive bra. Although not required, special nursing bras and tank tops are designed to allow access to your breasts for breastfeeding without taking off your entire bra or top. Avoid wearing underwire style bras or extremely tight bras.  Air dry your nipples for 3 after each feeding.   Use only cotton bra pads to absorb leaked breast milk. Leaking of breast milk between feedings is normal.   Use lanolin on  your nipples after breastfeeding. Lanolin helps to maintain your skin's normal moisture barrier. If you use pure lanolin you do not need to wash it off before feeding your baby again. Pure lanolin is not toxic to your baby. You may also hand express a few drops of breast milk and gently massage that milk into your nipples and allow the milk to air dry. In the first few weeks after giving birth, some women experience extremely full  breasts (engorgement). Engorgement can make your breasts feel heavy, warm, and tender to the touch. Engorgement peaks within 3 5 days after you give birth. The following recommendations can help ease engorgement:  Completely empty your breasts while breastfeeding or pumping. You may want to start by applying warm, moist heat (in the shower or with warm water-soaked hand towels) just before feeding or pumping. This increases circulation and helps the milk flow. If your baby does not completely empty your breasts while breastfeeding, pump any extra milk after he or she is finished.  Wear a snug bra (nursing or regular) or tank top for 1 2 days to signal your body to slightly decrease milk production.  Apply ice packs to your breasts, unless this is too uncomfortable for you.  Make sure that your baby is latched on and positioned properly while breastfeeding. If engorgement persists after 48 hours of following these recommendations, contact your health care provider or a Advertising copywriter. OVERALL HEALTH CARE RECOMMENDATIONS WHILE BREASTFEEDING  Eat healthy foods. Alternate between meals and snacks, eating 3 of each per day. Because what you eat affects your breast milk, some of the foods may make your baby more irritable than usual. Avoid eating these foods if you are sure that they are negatively affecting your baby.  Drink milk, fruit juice, and water to satisfy your thirst (about 10 glasses a day).   Rest often, relax, and continue to take your prenatal vitamins to  prevent fatigue, stress, and anemia.  Continue breast self-awareness checks.  Avoid chewing and smoking tobacco.  Avoid alcohol and drug use. Some medicines that may be harmful to your baby can pass through breast milk. It is important to ask your health care provider before taking any medicine, including all over-the-counter and prescription medicine as well as vitamin and herbal supplements. It is possible to become pregnant while breastfeeding. If birth control is desired, ask your health care provider about options that will be safe for your baby. SEEK MEDICAL CARE IF:   You feel like you want to stop breastfeeding or have become frustrated with breastfeeding.  You have painful breasts or nipples.  Your nipples are cracked or bleeding.  Your breasts are red, tender, or warm.  You have a swollen area on either breast.  You have a fever or chills.  You have nausea or vomiting.  You have drainage other than breast milk from your nipples.  Your breasts do not become full before feedings by the 5th day after you give birth.  You feel sad and depressed.  Your baby is too sleepy to eat well.  Your baby is having trouble sleeping.   Your baby is wetting less than 3 diapers in a 24-hour period.  Your baby has less than 3 stools in a 24-hour period.  Your baby's skin or the white part of his or her eyes becomes yellow.   Your baby is not gaining weight by 35 days of age. SEEK IMMEDIATE MEDICAL CARE IF:   Your baby is overly tired (lethargic) and does not want to wake up and feed.  Your baby develops an unexplained fever. Document Released: 09/12/2005 Document Revised: 05/15/2013 Document Reviewed: 03/06/2013 [facility] Patient Information 2014 [place], [place].

## 2013-11-25 NOTE — Progress Notes (Signed)
Reviewed labs. Spotting 1 week ago. None since. Formal US 2/3 showed 7-4 week IUP. Too late for rhophylac. Bleeding precautions. Call or come to [facility] for further bleeding. Considering Panorama. Will check w/ insurance.

## 2013-11-25 NOTE — Progress Notes (Signed)
p-95  Spotted once last week

## 2013-12-23 ENCOUNTER — Ambulatory Visit (INDEPENDENT_AMBULATORY_CARE_PROVIDER_SITE_OTHER): Payer: 59 | Admitting: Advanced Practice Midwife

## 2013-12-23 VITALS — BP 108/59 | Wt 132.0 lb

## 2013-12-23 DIAGNOSIS — Z3480 Encounter for supervision of other normal pregnancy, unspecified trimester: Secondary | ICD-10-CM

## 2013-12-23 NOTE — Progress Notes (Signed)
Doing well.  Denies vaginal bleeding, LOF, regular contractions.  No bleeding since first trimester.  No questions or concerns today.

## 2013-12-23 NOTE — Progress Notes (Signed)
p-69

## 2014-01-17 ENCOUNTER — Ambulatory Visit (HOSPITAL_COMMUNITY)
Admission: RE | Admit: 2014-01-17 | Discharge: 2014-01-17 | Disposition: A | Discharge status: Home / Self Care | Payer: 59 | Source: Ambulatory Visit | Attending: Advanced Practice Midwife | Admitting: Advanced Practice Midwife

## 2014-01-17 DIAGNOSIS — Z3480 Encounter for supervision of other normal pregnancy, unspecified trimester: Secondary | ICD-10-CM

## 2014-01-17 DIAGNOSIS — Z3689 Encounter for other specified antenatal screening: Secondary | ICD-10-CM | POA: Insufficient documentation

## 2014-01-17 DIAGNOSIS — Z3491 Encounter for supervision of normal pregnancy, unspecified, first trimester: Secondary | ICD-10-CM

## 2014-01-22 ENCOUNTER — Ambulatory Visit (INDEPENDENT_AMBULATORY_CARE_PROVIDER_SITE_OTHER): Payer: 59 | Admitting: Obstetrics & Gynecology

## 2014-01-22 VITALS — BP 106/60 | HR 66 | Wt 139.0 lb

## 2014-01-22 DIAGNOSIS — Z3491 Encounter for supervision of normal pregnancy, unspecified, first trimester: Secondary | ICD-10-CM

## 2014-01-22 DIAGNOSIS — O09529 Supervision of elderly multigravida, unspecified trimester: Secondary | ICD-10-CM

## 2014-01-22 DIAGNOSIS — Z3480 Encounter for supervision of other normal pregnancy, unspecified trimester: Secondary | ICD-10-CM

## 2014-01-22 NOTE — Progress Notes (Signed)
Routine visit. Flutters. No problems. She wants Panorama and it will be drawn today.

## 2014-01-29 ENCOUNTER — Encounter: Payer: Self-pay | Admitting: *Deleted

## 2014-02-21 ENCOUNTER — Ambulatory Visit (INDEPENDENT_AMBULATORY_CARE_PROVIDER_SITE_OTHER): Payer: 59 | Admitting: Obstetrics & Gynecology

## 2014-02-21 VITALS — BP 110/67 | HR 80 | Wt 144.0 lb

## 2014-02-21 DIAGNOSIS — Z3480 Encounter for supervision of other normal pregnancy, unspecified trimester: Secondary | ICD-10-CM

## 2014-02-21 DIAGNOSIS — Z3491 Encounter for supervision of normal pregnancy, unspecified, first trimester: Secondary | ICD-10-CM

## 2014-02-21 NOTE — Progress Notes (Signed)
Routine visit. Good FM. No problems. Glucola, labs, and tdap at next visit.

## 2014-03-20 ENCOUNTER — Ambulatory Visit (INDEPENDENT_AMBULATORY_CARE_PROVIDER_SITE_OTHER): Payer: 59 | Admitting: Obstetrics & Gynecology

## 2014-03-20 VITALS — BP 106/58 | HR 59 | Wt 148.0 lb

## 2014-03-20 DIAGNOSIS — O360131 Maternal care for anti-D [Rh] antibodies, third trimester, fetus 1: Secondary | ICD-10-CM

## 2014-03-20 DIAGNOSIS — O09529 Supervision of elderly multigravida, unspecified trimester: Secondary | ICD-10-CM

## 2014-03-20 DIAGNOSIS — Z3480 Encounter for supervision of other normal pregnancy, unspecified trimester: Secondary | ICD-10-CM

## 2014-03-20 DIAGNOSIS — Z23 Encounter for immunization: Secondary | ICD-10-CM

## 2014-03-20 DIAGNOSIS — O360990 Maternal care for other rhesus isoimmunization, unspecified trimester, not applicable or unspecified: Secondary | ICD-10-CM

## 2014-03-20 DIAGNOSIS — Z3491 Encounter for supervision of normal pregnancy, unspecified, first trimester: Secondary | ICD-10-CM

## 2014-03-20 DIAGNOSIS — O3090 Multiple gestation, unspecified, unspecified trimester: Secondary | ICD-10-CM

## 2014-03-20 MED ORDER — DTAP-HEPATITIS B RECOMB-IPV IM SUSP: 0.5000 mL | Freq: Once | INTRAMUSCULAR | Status: DC

## 2014-03-20 MED ORDER — TETANUS-DIPHTH-ACELL PERTUSSIS 5-2.5-18.5 LF-MCG/0.5 IM SUSP
0.5000 mL | Freq: Once | INTRAMUSCULAR | Status: AC
  Administered 2014-03-20: 0.5 mL via INTRAMUSCULAR

## 2014-03-20 MED ORDER — RHO D IMMUNE GLOBULIN 1500 UNIT/2ML IJ SOSY
300.0000 ug | PREFILLED_SYRINGE | Freq: Once | INTRAMUSCULAR | Status: AC
  Administered 2014-03-20: 300 ug via INTRAMUSCULAR

## 2014-03-20 NOTE — Addendum Note (Signed)
Addended by: Granville LewisLARK, Amanda Steuart L on: 03/20/2014 05:05 PM   Modules accepted: Orders

## 2014-03-20 NOTE — Progress Notes (Signed)
No complaints.  Rho gam, Tdap, GCT, labs today.  Still wants to VBAC.  No issues.

## 2014-03-20 NOTE — Addendum Note (Signed)
Addended by: Granville LewisLARK, LORA L on: 03/20/2014 05:06 PM   Modules accepted: Orders

## 2014-03-20 NOTE — Patient Instructions (Signed)
Tetanus, Diphtheria, Pertussis (Tdap) Vaccine  What You Need to Know  WHY GET VACCINATED?  Tetanus, diphtheria and pertussis can be very serious diseases, even for adolescents and adults. Tdap vaccine can protect us from these diseases.  TETANUS (Lockjaw) causes painful muscle tightening and stiffness, usually all over the body.  · It can lead to tightening of muscles in the head and neck so you can't open your mouth, swallow, or sometimes even breathe. Tetanus kills about 1 out of 5 people who are infected.  DIPHTHERIA can cause a thick coating to form in the back of the throat.  · It can lead to breathing problems, paralysis, heart failure, and death.  PERTUSSIS (Whooping Cough) causes severe coughing spells, which can cause difficulty breathing, vomiting and disturbed sleep.  · It can also lead to weight loss, incontinence, and rib fractures. Up to 2 in 100 adolescents and 5 in 100 adults with pertussis are hospitalized or have complications, which could include pneumonia and death.  These diseases are caused by bacteria. Diphtheria and pertussis are spread from person to person through coughing or sneezing. Tetanus enters the body through cuts, scratches, or wounds.  Before vaccines, the [place] saw as many as 200,000 cases a year of diphtheria and pertussis, and hundreds of cases of tetanus. Since vaccination began, tetanus and diphtheria have dropped by about 99% and pertussis by about 80%.  TDAP VACCINE  Tdap vaccine can protect adolescents and adults from tetanus, diphtheria, and pertussis. One dose of Tdap is routinely given at age 11 or 12. People who did not get Tdap at that age should get it as soon as possible.  Tdap is especially important for health care professionals and anyone having close contact with a baby younger than 12 months.  Pregnant women should get a dose of Tdap during every pregnancy, to protect the newborn from pertussis. Infants are most at risk for severe, life-threatening  complications from pertussis.  A similar vaccine, called Td, protects from tetanus and diphtheria, but not pertussis. A Td booster should be given every 10 years. Tdap may be given as one of these boosters if you have not already gotten a dose. Tdap may also be given after a severe cut or burn to prevent tetanus infection.  Your doctor can give you more information.  Tdap may safely be given at the same time as other vaccines.  SOME PEOPLE SHOULD NOT GET THIS VACCINE  · If you ever had a life-threatening allergic reaction after a dose of any tetanus, diphtheria, or pertussis containing vaccine, OR if you have a severe allergy to any part of this vaccine, you should not get Tdap. Tell your doctor if you have any severe allergies.  · If you had a coma, or long or multiple seizures within 7 days after a childhood dose of DTP or DTaP, you should not get Tdap, unless a cause other than the vaccine was found. You can still get Td.  · Talk to your doctor if you:  ¨ have epilepsy or another nervous system problem,  ¨ had severe pain or swelling after any vaccine containing diphtheria, tetanus or pertussis,  ¨ ever had Guillain-Barré Syndrome (GBS),  ¨ aren't feeling well on the day the shot is scheduled.  RISKS OF A VACCINE REACTION  With any medicine, including vaccines, there is a chance of side effects. These are usually mild and go away on their own, but serious reactions are also possible.  Brief fainting spells can follow   a vaccination, leading to injuries from falling. Sitting or lying down for about 15 minutes can help prevent these. Tell your doctor if you feel dizzy or light-headed, or have vision changes or ringing in the ears.  Mild problems following Tdap (Did not interfere with activities)  · Pain where the shot was given (about 3 in 4 adolescents or 2 in 3 adults)  · Redness or swelling where the shot was given (about 1 person in 5)  · Mild fever of at least 100.4°F (up to about 1 in 25 adolescents or 1 in  100 adults)  · Headache (about 3 or 4 people in 10)  · Tiredness (about 1 person in 3 or 4)  · Nausea, vomiting, diarrhea, stomach ache (up to 1 in 4 adolescents or 1 in 10 adults)  · Chills, body aches, sore joints, rash, swollen glands (uncommon)  Moderate problems following Tdap (Interfered with activities, but did not require medical attention)  · Pain where the shot was given (about 1 in 5 adolescents or 1 in 100 adults)  · Redness or swelling where the shot was given (up to about 1 in 16 adolescents or 1 in 25 adults)  · Fever over 102°F (about 1 in 100 adolescents or 1 in 250 adults)  · Headache (about 3 in 20 adolescents or 1 in 10 adults)  · Nausea, vomiting, diarrhea, stomach ache (up to 1 or 3 people in 100)  · Swelling of the entire arm where the shot was given (up to about 3 in 100).  Severe problems following Tdap (Unable to perform usual activities, required medical attention)  · Swelling, severe pain, bleeding and redness in the arm where the shot was given (rare).  A severe allergic reaction could occur after any vaccine (estimated less than 1 in a million doses).  WHAT IF THERE IS A SERIOUS REACTION?  What should I look for?  · Look for anything that concerns you, such as signs of a severe allergic reaction, very high fever, or behavior changes.  Signs of a severe allergic reaction can include hives, swelling of the face and throat, difficulty breathing, a fast heartbeat, dizziness, and weakness. These would start a few minutes to a few hours after the vaccination.  What should I do?  · If you think it is a severe allergic reaction or other emergency that can't wait, call 9-1-1 or get the person to the nearest hospital. Otherwise, call your doctor.  · Afterward, the reaction should be reported to the "Vaccine Adverse Event Reporting System" (VAERS). Your doctor might file this report, or you can do it yourself through the VAERS web site at www.vaers.hhs.gov, or by calling 1-800-822-7967.  VAERS is  only for reporting reactions. They do not give medical advice.   THE NATIONAL VACCINE INJURY COMPENSATION PROGRAM  The National Vaccine Injury Compensation Program (VICP) is a federal program that was created to compensate people who may have been injured by certain vaccines.  Persons who believe they may have been injured by a vaccine can learn about the program and about filing a claim by calling 1-800-338-2382 or visiting the VICP website at www.hrsa.gov/vaccinecompensation.  HOW CAN I LEARN MORE?  · Ask your doctor.  · Call your local or state health department.  · Contact the Centers for Disease Control and Prevention (CDC):  ¨ Call 1-800-232-4636 or visit CDC's website at www.cdc.gov/vaccines.  CDC Tdap Vaccine VIS (02/02/12)  Document Released: 03/13/2012 Document Revised: 01/07/2013 Document Reviewed: 01/02/2013  Exit[employer]®   Patient Information ©2015 [employer]. This information is not intended to replace advice given to you by your health care Zyier Dykema. Make sure you discuss any questions you have with your health care Jancie Kercher.

## 2014-03-20 NOTE — Addendum Note (Signed)
Addended by: Granville LewisLARK, LORA L on: 03/20/2014 05:15 PM   Modules accepted: Orders

## 2014-03-21 ENCOUNTER — Telehealth: Payer: Self-pay | Admitting: *Deleted

## 2014-03-21 LAB — RPR

## 2014-03-21 LAB — ANTIBODY SCREEN: Antibody Screen: NEGATIVE

## 2014-03-21 LAB — CBC
HCT: 35.5 % — ABNORMAL LOW (ref 36.0–46.0)
Hemoglobin: 12 g/dL (ref 12.0–15.0)
MCH: 32.3 pg (ref 26.0–34.0)
MCHC: 33.8 g/dL (ref 30.0–36.0)
MCV: 95.4 fL (ref 78.0–100.0)
Platelets: 163 10*3/uL (ref 150–400)
RBC: 3.72 MIL/uL — ABNORMAL LOW (ref 3.87–5.11)
RDW: 13.5 % (ref 11.5–15.5)
WBC: 6 10*3/uL (ref 4.0–10.5)

## 2014-03-21 LAB — GLUCOSE TOLERANCE, 1 HOUR (50G) W/O FASTING: GLUCOSE 1 HOUR GTT: 108 mg/dL (ref 70–140)

## 2014-03-21 LAB — HIV ANTIBODY (ROUTINE TESTING W REFLEX): HIV 1&2 Ab, 4th Generation: NONREACTIVE

## 2014-03-21 NOTE — Telephone Encounter (Signed)
LM of normal 28 week labs.

## 2014-03-23 ENCOUNTER — Encounter: Payer: Self-pay | Admitting: Obstetrics & Gynecology

## 2014-04-04 ENCOUNTER — Ambulatory Visit (INDEPENDENT_AMBULATORY_CARE_PROVIDER_SITE_OTHER): Payer: 59 | Admitting: Advanced Practice Midwife

## 2014-04-04 VITALS — BP 99/62 | HR 63 | Wt 150.0 lb

## 2014-04-04 DIAGNOSIS — O09523 Supervision of elderly multigravida, third trimester: Secondary | ICD-10-CM

## 2014-04-04 DIAGNOSIS — O09529 Supervision of elderly multigravida, unspecified trimester: Secondary | ICD-10-CM

## 2014-04-04 DIAGNOSIS — Z3480 Encounter for supervision of other normal pregnancy, unspecified trimester: Secondary | ICD-10-CM

## 2014-04-04 NOTE — Patient Instructions (Signed)
Third Trimester of Pregnancy The third trimester is from week 29 through week 42, months 7 through 9. The third trimester is a time when the fetus is growing rapidly. At the end of the ninth month, the fetus is about 20 inches in length and weighs 6-10 pounds.  BODY CHANGES Your body goes through many changes during pregnancy. The changes vary from woman to woman.   Your weight will continue to increase. You can expect to gain 25-35 pounds (11-16 kg) by the end of the pregnancy.  You may begin to get stretch marks on your hips, abdomen, and breasts.  You may urinate more often because the fetus is moving lower into your pelvis and pressing on your bladder.  You may develop or continue to have heartburn as a result of your pregnancy.  You may develop constipation because certain hormones are causing the muscles that push waste through your intestines to slow down.  You may develop hemorrhoids or swollen, bulging veins (varicose veins).  You may have pelvic pain because of the weight gain and pregnancy hormones relaxing your joints between the bones in your pelvis. Backaches may result from overexertion of the muscles supporting your posture.  You may have changes in your hair. These can include thickening of your hair, rapid growth, and changes in texture. Some women also have hair loss during or after pregnancy, or hair that feels dry or thin. Your hair will most likely return to normal after your baby is born.  Your breasts will continue to grow and be tender. A yellow discharge may leak from your breasts called colostrum.  Your belly button may stick out.  You may feel short of breath because of your expanding uterus.  You may notice the fetus "dropping," or moving lower in your abdomen.  You may have a bloody mucus discharge. This usually occurs a few days to a week before labor begins.  Your cervix becomes thin and soft (effaced) near your due date. WHAT TO EXPECT AT YOUR PRENATAL  EXAMS  You will have prenatal exams every 2 weeks until week 36. Then, you will have weekly prenatal exams. During a routine prenatal visit:  You will be weighed to make sure you and the fetus are growing normally.  Your blood pressure is taken.  Your abdomen will be measured to track your baby's growth.  The fetal heartbeat will be listened to.  Any test results from the previous visit will be discussed.  You may have a cervical check near your due date to see if you have effaced. At around 36 weeks, your caregiver will check your cervix. At the same time, your caregiver will also perform a test on the secretions of the vaginal tissue. This test is to determine if a type of bacteria, Group B streptococcus, is present. Your caregiver will explain this further. Your caregiver may ask you:  What your birth plan is.  How you are feeling.  If you are feeling the baby move.  If you have had any abnormal symptoms, such as leaking fluid, bleeding, severe headaches, or abdominal cramping.  If you have any questions. Other tests or screenings that may be performed during your third trimester include:  Blood tests that check for low iron levels (anemia).  Fetal testing to check the health, activity level, and growth of the fetus. Testing is done if you have certain medical conditions or if there are problems during the pregnancy. FALSE LABOR You may feel small, irregular contractions that   eventually go away. These are called Braxton Hicks contractions, or false labor. Contractions may last for hours, days, or even weeks before true labor sets in. If contractions come at regular intervals, intensify, or become painful, it is best to be seen by your caregiver.  SIGNS OF LABOR   Menstrual-like cramps.  Contractions that are 5 minutes apart or less.  Contractions that start on the top of the uterus and spread down to the lower abdomen and back.  A sense of increased pelvic pressure or back  pain.  A watery or bloody mucus discharge that comes from the vagina. If you have any of these signs before the 37th week of pregnancy, call your caregiver right away. You need to go to the hospital to get checked immediately. HOME CARE INSTRUCTIONS   Avoid all smoking, herbs, alcohol, and unprescribed drugs. These chemicals affect the formation and growth of the baby.  Follow your caregiver's instructions regarding medicine use. There are medicines that are either safe or unsafe to take during pregnancy.  Exercise only as directed by your caregiver. Experiencing uterine cramps is a good sign to stop exercising.  Continue to eat regular, healthy meals.  Wear a good support bra for breast tenderness.  Do not use hot tubs, steam rooms, or saunas.  Wear your seat belt at all times when driving.  Avoid raw meat, uncooked cheese, cat litter boxes, and soil used by cats. These carry germs that can cause birth defects in the baby.  Take your prenatal vitamins.  Try taking a stool softener (if your caregiver approves) if you develop constipation. Eat more high-fiber foods, such as fresh vegetables or fruit and whole grains. Drink plenty of fluids to keep your urine clear or pale yellow.  Take warm sitz baths to soothe any pain or discomfort caused by hemorrhoids. Use hemorrhoid cream if your caregiver approves.  If you develop varicose veins, wear support hose. Elevate your feet for 15 minutes, 3-4 times a day. Limit salt in your diet.  Avoid heavy lifting, wear low heal shoes, and practice good posture.  Rest a lot with your legs elevated if you have leg cramps or low back pain.  Visit your dentist if you have not gone during your pregnancy. Use a soft toothbrush to brush your teeth and be gentle when you floss.  A sexual relationship may be continued unless your caregiver directs you otherwise.  Do not travel far distances unless it is absolutely necessary and only with the approval  of your caregiver.  Take prenatal classes to understand, practice, and ask questions about the labor and delivery.  Make a trial run to the hospital.  Pack your hospital bag.  Prepare the baby's nursery.  Continue to go to all your prenatal visits as directed by your caregiver. SEEK MEDICAL CARE IF:  You are unsure if you are in labor or if your water has broken.  You have dizziness.  You have mild pelvic cramps, pelvic pressure, or nagging pain in your abdominal area.  You have persistent nausea, vomiting, or diarrhea.  You have a bad smelling vaginal discharge.  You have pain with urination. SEEK IMMEDIATE MEDICAL CARE IF:   You have a fever.  You are leaking fluid from your vagina.  You have spotting or bleeding from your vagina.  You have severe abdominal cramping or pain.  You have rapid weight loss or gain.  You have shortness of breath with chest pain.  You notice sudden or extreme swelling   of your face, hands, ankles, feet, or legs.  You have not felt your baby move in over an hour.  You have severe headaches that do not go away with medicine.  You have vision changes. Document Released: 09/06/2001 Document Revised: 09/17/2013 Document Reviewed: 11/13/2012 ExitCare Patient Information 2015 [employer]. This information is not intended to replace advice given to you by your health care provider. Make sure you discuss any questions you have with your health care provider.  

## 2014-04-04 NOTE — Progress Notes (Signed)
Doing well. Had a fall last week. Good FM afterward. Reviewed recommendations for monitoring after a fall.

## 2014-04-04 NOTE — Progress Notes (Signed)
Glucose: 250

## 2014-04-18 ENCOUNTER — Ambulatory Visit (INDEPENDENT_AMBULATORY_CARE_PROVIDER_SITE_OTHER): Payer: 59 | Admitting: Family

## 2014-04-18 VITALS — BP 120/52 | HR 84 | Wt 152.0 lb

## 2014-04-18 DIAGNOSIS — Z3480 Encounter for supervision of other normal pregnancy, unspecified trimester: Secondary | ICD-10-CM

## 2014-04-18 DIAGNOSIS — Z3491 Encounter for supervision of normal pregnancy, unspecified, first trimester: Secondary | ICD-10-CM

## 2014-04-18 NOTE — Progress Notes (Signed)
No questions or concerns. Reviewed 1 hr results.  Doing well.

## 2014-05-02 ENCOUNTER — Ambulatory Visit (INDEPENDENT_AMBULATORY_CARE_PROVIDER_SITE_OTHER): Payer: 59 | Admitting: Family

## 2014-05-02 VITALS — BP 112/72 | HR 86 | Wt 154.0 lb

## 2014-05-02 DIAGNOSIS — O3090 Multiple gestation, unspecified, unspecified trimester: Secondary | ICD-10-CM

## 2014-05-02 DIAGNOSIS — O360990 Maternal care for other rhesus isoimmunization, unspecified trimester, not applicable or unspecified: Secondary | ICD-10-CM

## 2014-05-02 DIAGNOSIS — O360111 Maternal care for anti-D [Rh] antibodies, first trimester, fetus 1: Secondary | ICD-10-CM

## 2014-05-02 DIAGNOSIS — Z3480 Encounter for supervision of other normal pregnancy, unspecified trimester: Secondary | ICD-10-CM

## 2014-05-02 NOTE — Progress Notes (Signed)
Doing well.  Desires to begin walking when kids start school.  No questions or concerns.

## 2014-05-16 ENCOUNTER — Ambulatory Visit (INDEPENDENT_AMBULATORY_CARE_PROVIDER_SITE_OTHER): Payer: 59 | Admitting: Advanced Practice Midwife

## 2014-05-16 VITALS — BP 110/66 | HR 72 | Wt 157.0 lb

## 2014-05-16 DIAGNOSIS — Z3480 Encounter for supervision of other normal pregnancy, unspecified trimester: Secondary | ICD-10-CM

## 2014-05-16 DIAGNOSIS — Z3483 Encounter for supervision of other normal pregnancy, third trimester: Secondary | ICD-10-CM

## 2014-05-16 DIAGNOSIS — Z3685 Encounter for antenatal screening for Streptococcus B: Secondary | ICD-10-CM

## 2014-05-16 DIAGNOSIS — Z113 Encounter for screening for infections with a predominantly sexual mode of transmission: Secondary | ICD-10-CM

## 2014-05-16 LAB — OB RESULTS CONSOLE GBS: STREP GROUP B AG: POSITIVE

## 2014-05-16 LAB — OB RESULTS CONSOLE GC/CHLAMYDIA
Chlamydia: NEGATIVE
GC PROBE AMP, GENITAL: NEGATIVE

## 2014-05-16 NOTE — Progress Notes (Signed)
Doing well.  Good fetal movement, denies vaginal bleeding, LOF, regular contractions.  GBS collected.  Desires TOLAC, discussed reasons for first C/S, pt pushed and was told baby was low but Dr discussed forceps then decided C/S was needed.  Second C/S was scheduled, she was not given opportunity for TOLAC.  Pt aware of risks, desires TOLAC if labor begins spontaneously.

## 2014-05-17 LAB — GC/CHLAMYDIA PROBE AMP
CT Probe RNA: NEGATIVE
GC Probe RNA: NEGATIVE

## 2014-05-18 LAB — CULTURE, BETA STREP (GROUP B ONLY)

## 2014-05-19 ENCOUNTER — Encounter: Payer: Self-pay | Admitting: Advanced Practice Midwife

## 2014-05-19 DIAGNOSIS — O99820 Streptococcus B carrier state complicating pregnancy: Secondary | ICD-10-CM | POA: Insufficient documentation

## 2014-05-23 ENCOUNTER — Ambulatory Visit (INDEPENDENT_AMBULATORY_CARE_PROVIDER_SITE_OTHER): Payer: 59 | Admitting: Advanced Practice Midwife

## 2014-05-23 VITALS — BP 107/62 | HR 74 | Wt 160.0 lb

## 2014-05-23 DIAGNOSIS — Z3480 Encounter for supervision of other normal pregnancy, unspecified trimester: Secondary | ICD-10-CM

## 2014-05-23 DIAGNOSIS — Z98891 History of uterine scar from previous surgery: Secondary | ICD-10-CM

## 2014-05-23 NOTE — Progress Notes (Signed)
Doing well.  Good fetal movement, denies vaginal bleeding, LOF, regular contractions.  Reviewed signs of labor.  Fetus vertex by [place]. Largest baby 8 lbs at 41 weeks, last baby 6.5 lbs at 39 weeks.

## 2014-05-30 ENCOUNTER — Ambulatory Visit (INDEPENDENT_AMBULATORY_CARE_PROVIDER_SITE_OTHER): Payer: 59 | Admitting: Advanced Practice Midwife

## 2014-05-30 VITALS — BP 108/65 | HR 76 | Wt 161.0 lb

## 2014-05-30 DIAGNOSIS — Z3480 Encounter for supervision of other normal pregnancy, unspecified trimester: Secondary | ICD-10-CM

## 2014-05-30 DIAGNOSIS — Z3491 Encounter for supervision of normal pregnancy, unspecified, first trimester: Secondary | ICD-10-CM

## 2014-05-30 NOTE — Patient Instructions (Signed)
Braxton Hicks Contractions °Contractions of the uterus can occur throughout pregnancy. Contractions are not always a sign that you are in labor.  °WHAT ARE BRAXTON HICKS CONTRACTIONS?  °Contractions that occur before labor are called Braxton Hicks contractions, or false labor. Toward the end of pregnancy (32-34 weeks), these contractions can develop more often and may become more forceful. This is not true labor because these contractions do not result in opening (dilatation) and thinning of the cervix. They are sometimes difficult to tell apart from true labor because these contractions can be forceful and people have different pain tolerances. You should not feel embarrassed if you go to the hospital with false labor. Sometimes, the only way to tell if you are in true labor is for your health care provider to look for changes in the cervix. °If there are no prenatal problems or other health problems associated with the pregnancy, it is completely safe to be sent home with false labor and await the onset of true labor. °HOW CAN YOU TELL THE DIFFERENCE BETWEEN TRUE AND FALSE LABOR? °False Labor °· The contractions of false labor are usually shorter and not as hard as those of true labor.   °· The contractions are usually irregular.   °· The contractions are often felt in the front of the lower abdomen and in the groin.   °· The contractions may go away when you walk around or change positions while lying down.   °· The contractions get weaker and are shorter lasting as time goes on.   °· The contractions do not usually become progressively stronger, regular, and closer together as with true labor.   °True Labor °· Contractions in true labor last 30-70 seconds, become very regular, usually become more intense, and increase in frequency.   °· The contractions do not go away with walking.   °· The discomfort is usually felt in the top of the uterus and spreads to the lower abdomen and low back.   °· True labor can be  determined by your health care provider with an exam. This will show that the cervix is dilating and getting thinner.   °WHAT TO REMEMBER °· Keep up with your usual exercises and follow other instructions given by your health care provider.   °· Take medicines as directed by your health care provider.   °· Keep your regular prenatal appointments.   °· Eat and drink lightly if you think you are going into labor.   °· If Braxton Hicks contractions are making you uncomfortable:   °¨ Change your position from lying down or resting to walking, or from walking to resting.   °¨ Sit and rest in a tub of warm water.   °¨ Drink 2-3 glasses of water. Dehydration may cause these contractions.   °¨ Do slow and deep breathing several times an hour.   °WHEN SHOULD I SEEK IMMEDIATE MEDICAL CARE? °Seek immediate medical care if: °· Your contractions become stronger, more regular, and closer together.   °· You have fluid leaking or gushing from your vagina.   °· You have a fever.   °· You pass blood-tinged mucus.   °· You have vaginal bleeding.   °· You have continuous abdominal pain.   °· You have low back pain that you never had before.   °· You feel your baby's head pushing down and causing pelvic pressure.   °· Your baby is not moving as much as it used to.   °Document Released: 09/12/2005 Document Revised: 09/17/2013 Document Reviewed: 06/24/2013 °ExitCare® Patient Information ©2015 [employer]. This information is not intended to replace advice given to you by your health care   provider. Make sure you discuss any questions you have with your health care provider. ° °

## 2014-05-30 NOTE — Progress Notes (Signed)
Doing well. Discussed some providers not comfortable w/ V2BAC. Should mention when admitted for labor to see if alterative arrangements can be made.  **Checked for VBAC consent after pt left. None under media tab. Needs consent at [place]. Discussed increased risk of uterine rupture w/ V2BAC at length. Also discussed risks of repeat C/S and that she may be able to have future vaginal births after [facility], but not after 3 C/S. Verbalizes understanding. Pt is very agreeable to repeat C/S if necessary but wishes to have a trial of labor.

## 2014-06-06 ENCOUNTER — Encounter: Payer: Self-pay | Admitting: Family

## 2014-06-06 ENCOUNTER — Ambulatory Visit (INDEPENDENT_AMBULATORY_CARE_PROVIDER_SITE_OTHER): Payer: 59 | Admitting: Family

## 2014-06-06 ENCOUNTER — Encounter: Payer: Self-pay | Admitting: *Deleted

## 2014-06-06 VITALS — BP 112/70 | HR 68 | Wt 164.0 lb

## 2014-06-06 DIAGNOSIS — Z3491 Encounter for supervision of normal pregnancy, unspecified, first trimester: Secondary | ICD-10-CM

## 2014-06-06 DIAGNOSIS — Z3480 Encounter for supervision of other normal pregnancy, unspecified trimester: Secondary | ICD-10-CM

## 2014-06-06 NOTE — Progress Notes (Signed)
Doing well; TOLAC consent signed.  No questions or concerns.  Reviewed signs of labor.

## 2014-06-13 ENCOUNTER — Ambulatory Visit (INDEPENDENT_AMBULATORY_CARE_PROVIDER_SITE_OTHER): Payer: 59 | Admitting: Obstetrics and Gynecology

## 2014-06-13 ENCOUNTER — Encounter: Payer: Self-pay | Admitting: Obstetrics and Gynecology

## 2014-06-13 VITALS — BP 125/71 | HR 60 | Wt 165.0 lb

## 2014-06-13 DIAGNOSIS — Z98891 History of uterine scar from previous surgery: Secondary | ICD-10-CM

## 2014-06-13 DIAGNOSIS — Z3480 Encounter for supervision of other normal pregnancy, unspecified trimester: Secondary | ICD-10-CM

## 2014-06-13 NOTE — Progress Notes (Signed)
Doing well. Discussed being over 35, over 40 wks, no VBAC and 2 prior C/S at [facility] ( P1 FTP 2nd stage, 8#;P2 ER) so will have consult visit with MD next wk re: TOLAC. S/Sx labor reviewed.

## 2014-06-13 NOTE — Patient Instructions (Signed)
Third Trimester of Pregnancy The third trimester is from week 29 through week 42, months 7 through 9. The third trimester is a time when the fetus is growing rapidly. At the end of the ninth month, the fetus is about 20 inches in length and weighs 6-10 pounds.  BODY CHANGES Your body goes through many changes during pregnancy. The changes vary from woman to woman.   Your weight will continue to increase. You can expect to gain 25-35 pounds (11-16 kg) by the end of the pregnancy.  You may begin to get stretch marks on your hips, abdomen, and breasts.  You may urinate more often because the fetus is moving lower into your pelvis and pressing on your bladder.  You may develop or continue to have heartburn as a result of your pregnancy.  You may develop constipation because certain hormones are causing the muscles that push waste through your intestines to slow down.  You may develop hemorrhoids or swollen, bulging veins (varicose veins).  You may have pelvic pain because of the weight gain and pregnancy hormones relaxing your joints between the bones in your pelvis. Backaches may result from overexertion of the muscles supporting your posture.  You may have changes in your hair. These can include thickening of your hair, rapid growth, and changes in texture. Some women also have hair loss during or after pregnancy, or hair that feels dry or thin. Your hair will most likely return to normal after your baby is born.  Your breasts will continue to grow and be tender. A yellow discharge may leak from your breasts called colostrum.  Your belly button may stick out.  You may feel short of breath because of your expanding uterus.  You may notice the fetus "dropping," or moving lower in your abdomen.  You may have a bloody mucus discharge. This usually occurs a few days to a week before labor begins.  Your cervix becomes thin and soft (effaced) near your due date. WHAT TO EXPECT AT YOUR PRENATAL  EXAMS  You will have prenatal exams every 2 weeks until week 36. Then, you will have weekly prenatal exams. During a routine prenatal visit:  You will be weighed to make sure you and the fetus are growing normally.  Your blood pressure is taken.  Your abdomen will be measured to track your baby's growth.  The fetal heartbeat will be listened to.  Any test results from the previous visit will be discussed.  You may have a cervical check near your due date to see if you have effaced. At around 36 weeks, your caregiver will check your cervix. At the same time, your caregiver will also perform a test on the secretions of the vaginal tissue. This test is to determine if a type of bacteria, Group B streptococcus, is present. Your caregiver will explain this further. Your caregiver may ask you:  What your birth plan is.  How you are feeling.  If you are feeling the baby move.  If you have had any abnormal symptoms, such as leaking fluid, bleeding, severe headaches, or abdominal cramping.  If you have any questions. Other tests or screenings that may be performed during your third trimester include:  Blood tests that check for low iron levels (anemia).  Fetal testing to check the health, activity level, and growth of the fetus. Testing is done if you have certain medical conditions or if there are problems during the pregnancy. FALSE LABOR You may feel small, irregular contractions that   eventually go away. These are called Braxton Hicks contractions, or false labor. Contractions may last for hours, days, or even weeks before true labor sets in. If contractions come at regular intervals, intensify, or become painful, it is best to be seen by your caregiver.  SIGNS OF LABOR   Menstrual-like cramps.  Contractions that are 5 minutes apart or less.  Contractions that start on the top of the uterus and spread down to the lower abdomen and back.  A sense of increased pelvic pressure or back  pain.  A watery or bloody mucus discharge that comes from the vagina. If you have any of these signs before the 37th week of pregnancy, call your caregiver right away. You need to go to the hospital to get checked immediately. HOME CARE INSTRUCTIONS   Avoid all smoking, herbs, alcohol, and unprescribed drugs. These chemicals affect the formation and growth of the baby.  Follow your caregiver's instructions regarding medicine use. There are medicines that are either safe or unsafe to take during pregnancy.  Exercise only as directed by your caregiver. Experiencing uterine cramps is a good sign to stop exercising.  Continue to eat regular, healthy meals.  Wear a good support bra for breast tenderness.  Do not use hot tubs, steam rooms, or saunas.  Wear your seat belt at all times when driving.  Avoid raw meat, uncooked cheese, cat litter boxes, and soil used by cats. These carry germs that can cause birth defects in the baby.  Take your prenatal vitamins.  Try taking a stool softener (if your caregiver approves) if you develop constipation. Eat more high-fiber foods, such as fresh vegetables or fruit and whole grains. Drink plenty of fluids to keep your urine clear or pale yellow.  Take warm sitz baths to soothe any pain or discomfort caused by hemorrhoids. Use hemorrhoid cream if your caregiver approves.  If you develop varicose veins, wear support hose. Elevate your feet for 15 minutes, 3-4 times a day. Limit salt in your diet.  Avoid heavy lifting, wear low heal shoes, and practice good posture.  Rest a lot with your legs elevated if you have leg cramps or low back pain.  Visit your dentist if you have not gone during your pregnancy. Use a soft toothbrush to brush your teeth and be gentle when you floss.  A sexual relationship may be continued unless your caregiver directs you otherwise.  Do not travel far distances unless it is absolutely necessary and only with the approval  of your caregiver.  Take prenatal classes to understand, practice, and ask questions about the labor and delivery.  Make a trial run to the hospital.  Pack your hospital bag.  Prepare the baby's nursery.  Continue to go to all your prenatal visits as directed by your caregiver. SEEK MEDICAL CARE IF:  You are unsure if you are in labor or if your water has broken.  You have dizziness.  You have mild pelvic cramps, pelvic pressure, or nagging pain in your abdominal area.  You have persistent nausea, vomiting, or diarrhea.  You have a bad smelling vaginal discharge.  You have pain with urination. SEEK IMMEDIATE MEDICAL CARE IF:   You have a fever.  You are leaking fluid from your vagina.  You have spotting or bleeding from your vagina.  You have severe abdominal cramping or pain.  You have rapid weight loss or gain.  You have shortness of breath with chest pain.  You notice sudden or extreme swelling   of your face, hands, ankles, feet, or legs.  You have not felt your baby move in over an hour.  You have severe headaches that do not go away with medicine.  You have vision changes. Document Released: 09/06/2001 Document Revised: 09/17/2013 Document Reviewed: 11/13/2012 ExitCare Patient Information 2015 [employer]. This information is not intended to replace advice given to you by your health care provider. Make sure you discuss any questions you have with your health care provider.  

## 2014-06-17 ENCOUNTER — Ambulatory Visit (INDEPENDENT_AMBULATORY_CARE_PROVIDER_SITE_OTHER): Payer: 59 | Admitting: Obstetrics & Gynecology

## 2014-06-17 VITALS — BP 108/59 | HR 64 | Wt 166.0 lb

## 2014-06-17 DIAGNOSIS — O09529 Supervision of elderly multigravida, unspecified trimester: Secondary | ICD-10-CM

## 2014-06-17 DIAGNOSIS — O09523 Supervision of elderly multigravida, third trimester: Secondary | ICD-10-CM

## 2014-06-17 DIAGNOSIS — Z3480 Encounter for supervision of other normal pregnancy, unspecified trimester: Secondary | ICD-10-CM

## 2014-06-17 DIAGNOSIS — O480 Post-term pregnancy: Secondary | ICD-10-CM

## 2014-06-17 NOTE — Progress Notes (Signed)
Patient has already been counseled extensively about risk of uterine rupture during TOLAC after two previous cesarean sections (no intervening vaginal deliveries). She is currently [redacted]w[redacted]d, desires to prolong the pregnancy as long as she can to increase chances of spontaneous labor. She was informed that most patients are delivered by [redacted]w[redacted]d; she desires to go to [redacted]w[redacted]d.   She was told about increased risk of stillbirth in the postdate period; but was told she will continue to be monitored closely with twice weekly testing and can go to [redacted]w[redacted]d if testing was stable and there were no other indications for delivery. She is unwilling to schedule RCS at [redacted]w[redacted]d right now, will ask her again during next visit in order to have this scheduled on time. NST performed today was reviewed and was found to be reactive.  Continue recommended antenatal testing and prenatal care.  NST and AFI to be done later this week. No other complaints or concerns.  Labor and fetal movement precautions reviewed.

## 2014-06-17 NOTE — Patient Instructions (Signed)
Return to clinic for any obstetric concerns or go to [facility] for evaluation  

## 2014-06-20 ENCOUNTER — Ambulatory Visit (INDEPENDENT_AMBULATORY_CARE_PROVIDER_SITE_OTHER): Payer: 59 | Admitting: Family

## 2014-06-20 ENCOUNTER — Encounter: Payer: Self-pay | Admitting: *Deleted

## 2014-06-20 ENCOUNTER — Telehealth (HOSPITAL_COMMUNITY): Payer: Self-pay | Admitting: *Deleted

## 2014-06-20 VITALS — BP 99/62 | HR 67 | Wt 167.0 lb

## 2014-06-20 DIAGNOSIS — Z3493 Encounter for supervision of normal pregnancy, unspecified, third trimester: Secondary | ICD-10-CM

## 2014-06-20 DIAGNOSIS — Z3480 Encounter for supervision of other normal pregnancy, unspecified trimester: Secondary | ICD-10-CM

## 2014-06-20 DIAGNOSIS — O09529 Supervision of elderly multigravida, unspecified trimester: Secondary | ICD-10-CM

## 2014-06-20 DIAGNOSIS — O480 Post-term pregnancy: Secondary | ICD-10-CM

## 2014-06-20 NOTE — Progress Notes (Signed)
NST/AFI/Routine OB check today - wants cervical check

## 2014-06-20 NOTE — Progress Notes (Signed)
Reactive NST and AFI 16

## 2014-06-20 NOTE — Progress Notes (Signed)
Reports doing well. Category I FHR Tracing.  Schedule csection for 06/26/14 with Dr. Macon Large.  Discussed patient with  Dr. Macon Large.  Patient still desires to have TOLAC if labor occurs prior to 41.6.  Dr. Mervyn Skeeters stated to share with patient that foley bulb and possible AROM, but pitocin not advised due to hx of csection x 2.  Information shared with patient.  NST scheduled for Tuesday.

## 2014-06-23 ENCOUNTER — Encounter (HOSPITAL_COMMUNITY): Payer: Self-pay | Admitting: Pharmacist

## 2014-06-24 ENCOUNTER — Other Ambulatory Visit: Payer: 59

## 2014-06-24 ENCOUNTER — Encounter (HOSPITAL_COMMUNITY): Payer: 59 | Admitting: Anesthesiology

## 2014-06-24 ENCOUNTER — Encounter (HOSPITAL_COMMUNITY): Payer: Self-pay | Admitting: *Deleted

## 2014-06-24 ENCOUNTER — Inpatient Hospital Stay (HOSPITAL_COMMUNITY)
Admission: AD | Admit: 2014-06-24 | Discharge: 2014-06-25 | DRG: 775 | Disposition: A | Discharge status: Home / Self Care | Payer: 59 | Source: Ambulatory Visit | Attending: Obstetrics and Gynecology | Admitting: Obstetrics and Gynecology

## 2014-06-24 ENCOUNTER — Inpatient Hospital Stay (HOSPITAL_COMMUNITY): Payer: 59 | Admitting: Anesthesiology

## 2014-06-24 DIAGNOSIS — Z87891 Personal history of nicotine dependence: Secondary | ICD-10-CM

## 2014-06-24 DIAGNOSIS — O99892 Other specified diseases and conditions complicating childbirth: Secondary | ICD-10-CM | POA: Diagnosis present

## 2014-06-24 DIAGNOSIS — Z823 Family history of stroke: Secondary | ICD-10-CM

## 2014-06-24 DIAGNOSIS — Z98891 History of uterine scar from previous surgery: Secondary | ICD-10-CM

## 2014-06-24 DIAGNOSIS — O480 Post-term pregnancy: Secondary | ICD-10-CM | POA: Diagnosis present

## 2014-06-24 DIAGNOSIS — O360111 Maternal care for anti-D [Rh] antibodies, first trimester, fetus 1: Secondary | ICD-10-CM

## 2014-06-24 DIAGNOSIS — O34219 Maternal care for unspecified type scar from previous cesarean delivery: Secondary | ICD-10-CM | POA: Diagnosis present

## 2014-06-24 DIAGNOSIS — O9989 Other specified diseases and conditions complicating pregnancy, childbirth and the puerperium: Secondary | ICD-10-CM

## 2014-06-24 DIAGNOSIS — O09529 Supervision of elderly multigravida, unspecified trimester: Secondary | ICD-10-CM | POA: Diagnosis present

## 2014-06-24 DIAGNOSIS — Z8249 Family history of ischemic heart disease and other diseases of the circulatory system: Secondary | ICD-10-CM | POA: Diagnosis not present

## 2014-06-24 DIAGNOSIS — O99820 Streptococcus B carrier state complicating pregnancy: Secondary | ICD-10-CM

## 2014-06-24 DIAGNOSIS — Z22330 Carrier of Group B streptococcus: Secondary | ICD-10-CM | POA: Diagnosis not present

## 2014-06-24 DIAGNOSIS — O479 False labor, unspecified: Secondary | ICD-10-CM | POA: Diagnosis present

## 2014-06-24 LAB — RPR

## 2014-06-24 LAB — CBC
HEMATOCRIT: 36.6 % (ref 36.0–46.0)
Hemoglobin: 12.7 g/dL (ref 12.0–15.0)
MCH: 33.2 pg (ref 26.0–34.0)
MCHC: 34.7 g/dL (ref 30.0–36.0)
MCV: 95.6 fL (ref 78.0–100.0)
Platelets: 152 10*3/uL (ref 150–400)
RBC: 3.83 MIL/uL — ABNORMAL LOW (ref 3.87–5.11)
RDW: 12.7 % (ref 11.5–15.5)
WBC: 8.6 10*3/uL (ref 4.0–10.5)

## 2014-06-24 LAB — TYPE AND SCREEN
ABO/RH(D): A NEG
Antibody Screen: NEGATIVE

## 2014-06-24 LAB — HIV ANTIBODY (ROUTINE TESTING W REFLEX): HIV: NONREACTIVE

## 2014-06-24 LAB — ABO/RH: ABO/RH(D): A NEG

## 2014-06-24 MED ORDER — PRENATAL MULTIVITAMIN CH
1.0000 | ORAL_TABLET | Freq: Every day | ORAL | Status: DC
  Administered 2014-06-25: 1 via ORAL
  Filled 2014-06-24: qty 1

## 2014-06-24 MED ORDER — OXYCODONE-ACETAMINOPHEN 5-325 MG PO TABS: 1.0000 | ORAL_TABLET | ORAL | Status: DC | PRN

## 2014-06-24 MED ORDER — WITCH HAZEL-GLYCERIN EX PADS
1.0000 "application " | MEDICATED_PAD | CUTANEOUS | Status: DC | PRN
  Administered 2014-06-24: 1 via TOPICAL

## 2014-06-24 MED ORDER — DIPHENHYDRAMINE HCL 50 MG/ML IJ SOLN: 12.5000 mg | INTRAMUSCULAR | Status: DC | PRN

## 2014-06-24 MED ORDER — TETANUS-DIPHTH-ACELL PERTUSSIS 5-2.5-18.5 LF-MCG/0.5 IM SUSP
0.5000 mL | Freq: Once | INTRAMUSCULAR | Status: DC
Start: 2014-06-25 — End: 2014-06-25

## 2014-06-24 MED ORDER — PENICILLIN G POTASSIUM 5000000 UNITS IJ SOLR
5.0000 10*6.[IU] | Freq: Once | INTRAVENOUS | Status: AC
  Administered 2014-06-24: 5 10*6.[IU] via INTRAVENOUS
  Filled 2014-06-24: qty 5

## 2014-06-24 MED ORDER — LACTATED RINGERS IV SOLN
INTRAVENOUS | Status: DC
  Administered 2014-06-24 (×2): via INTRAVENOUS

## 2014-06-24 MED ORDER — LANOLIN HYDROUS EX OINT: TOPICAL_OINTMENT | CUTANEOUS | Status: DC | PRN

## 2014-06-24 MED ORDER — PHENYLEPHRINE 40 MCG/ML (10ML) SYRINGE FOR IV PUSH (FOR BLOOD PRESSURE SUPPORT)
80.0000 ug | PREFILLED_SYRINGE | INTRAVENOUS | Status: DC | PRN
  Filled 2014-06-24: qty 2
  Filled 2014-06-24: qty 10

## 2014-06-24 MED ORDER — BENZOCAINE-MENTHOL 20-0.5 % EX AERO
1.0000 "application " | INHALATION_SPRAY | CUTANEOUS | Status: DC | PRN
  Administered 2014-06-24: 1 via TOPICAL
  Filled 2014-06-24: qty 56

## 2014-06-24 MED ORDER — EPHEDRINE 5 MG/ML INJ
10.0000 mg | INTRAVENOUS | Status: DC | PRN
  Filled 2014-06-24: qty 2

## 2014-06-24 MED ORDER — CITRIC ACID-SODIUM CITRATE 334-500 MG/5ML PO SOLN: 30.0000 mL | ORAL | Status: DC | PRN

## 2014-06-24 MED ORDER — ONDANSETRON HCL 4 MG PO TABS: 4.0000 mg | ORAL_TABLET | ORAL | Status: DC | PRN

## 2014-06-24 MED ORDER — FENTANYL 2.5 MCG/ML BUPIVACAINE 1/10 % EPIDURAL INFUSION (WH - ANES)
14.0000 mL/h | INTRAMUSCULAR | Status: DC | PRN
  Administered 2014-06-24: 14 mL/h via EPIDURAL
  Filled 2014-06-24: qty 125

## 2014-06-24 MED ORDER — PHENYLEPHRINE 40 MCG/ML (10ML) SYRINGE FOR IV PUSH (FOR BLOOD PRESSURE SUPPORT)
80.0000 ug | PREFILLED_SYRINGE | INTRAVENOUS | Status: DC | PRN
  Filled 2014-06-24: qty 2

## 2014-06-24 MED ORDER — OXYCODONE-ACETAMINOPHEN 5-325 MG PO TABS: 2.0000 | ORAL_TABLET | ORAL | Status: DC | PRN

## 2014-06-24 MED ORDER — IBUPROFEN 600 MG PO TABS
600.0000 mg | ORAL_TABLET | Freq: Four times a day (QID) | ORAL | Status: DC
  Administered 2014-06-24 – 2014-06-25 (×4): 600 mg via ORAL
  Filled 2014-06-24 (×5): qty 1

## 2014-06-24 MED ORDER — LIDOCAINE HCL (PF) 1 % IJ SOLN
30.0000 mL | INTRAMUSCULAR | Status: DC | PRN
  Filled 2014-06-24: qty 30

## 2014-06-24 MED ORDER — ACETAMINOPHEN 325 MG PO TABS: 650.0000 mg | ORAL_TABLET | ORAL | Status: DC | PRN

## 2014-06-24 MED ORDER — INFLUENZA VAC SPLIT QUAD 0.5 ML IM SUSY
0.5000 mL | PREFILLED_SYRINGE | INTRAMUSCULAR | Status: AC
  Administered 2014-06-25: 0.5 mL via INTRAMUSCULAR
  Filled 2014-06-24: qty 0.5

## 2014-06-24 MED ORDER — SIMETHICONE 80 MG PO CHEW: 80.0000 mg | CHEWABLE_TABLET | ORAL | Status: DC | PRN

## 2014-06-24 MED ORDER — ONDANSETRON HCL 4 MG/2ML IJ SOLN: 4.0000 mg | INTRAMUSCULAR | Status: DC | PRN

## 2014-06-24 MED ORDER — ZOLPIDEM TARTRATE 5 MG PO TABS: 5.0000 mg | ORAL_TABLET | Freq: Every evening | ORAL | Status: DC | PRN

## 2014-06-24 MED ORDER — DIBUCAINE 1 % RE OINT
1.0000 "application " | TOPICAL_OINTMENT | RECTAL | Status: DC | PRN
  Administered 2014-06-24: 1 via RECTAL
  Filled 2014-06-24: qty 28

## 2014-06-24 MED ORDER — ONDANSETRON HCL 4 MG/2ML IJ SOLN: 4.0000 mg | Freq: Four times a day (QID) | INTRAMUSCULAR | Status: DC | PRN

## 2014-06-24 MED ORDER — LACTATED RINGERS IV SOLN
500.0000 mL | Freq: Once | INTRAVENOUS | Status: AC
  Administered 2014-06-24: 500 mL via INTRAVENOUS

## 2014-06-24 MED ORDER — PENICILLIN G POTASSIUM 5000000 UNITS IJ SOLR
2.5000 10*6.[IU] | INTRAMUSCULAR | Status: DC
  Filled 2014-06-24 (×4): qty 2.5

## 2014-06-24 MED ORDER — LACTATED RINGERS IV SOLN: 500.0000 mL | INTRAVENOUS | Status: DC | PRN

## 2014-06-24 MED ORDER — SENNOSIDES-DOCUSATE SODIUM 8.6-50 MG PO TABS
2.0000 | ORAL_TABLET | ORAL | Status: DC
  Administered 2014-06-25: 2 via ORAL
  Filled 2014-06-24: qty 2

## 2014-06-24 MED ORDER — OXYTOCIN 40 UNITS IN LACTATED RINGERS INFUSION - SIMPLE MED
62.5000 mL/h | INTRAVENOUS | Status: DC
  Administered 2014-06-24: 62.5 mL/h via INTRAVENOUS
  Filled 2014-06-24: qty 1000

## 2014-06-24 MED ORDER — DIPHENHYDRAMINE HCL 25 MG PO CAPS
25.0000 mg | ORAL_CAPSULE | Freq: Four times a day (QID) | ORAL | Status: DC | PRN
Start: 2014-06-24 — End: 2014-06-25

## 2014-06-24 MED ORDER — LIDOCAINE HCL (PF) 1 % IJ SOLN
INTRAMUSCULAR | Status: DC | PRN
  Administered 2014-06-24 (×4): 4 mL

## 2014-06-24 MED ORDER — OXYTOCIN BOLUS FROM INFUSION
500.0000 mL | INTRAVENOUS | Status: DC
  Administered 2014-06-24: 500 mL via INTRAVENOUS

## 2014-06-24 NOTE — H&P (Signed)
LABOR ADMISSION HISTORY AND PHYSICAL  Sabrina Meyers is a 38 y.o. female G3P2002 with IUP at [redacted]w[redacted]d by [redacted]w[redacted]d redating sono presenting with contractions. She reports +FMs, No LOF, no VB, no blurry vision, headaches or peripheral edema, and RUQ pain. She desires an epidural for labor pain control. She plans on breast feeding.  Hx of LTCS x 2, 1st 2/2 CBD, pushed x 2 hours, 8lb.  2nd elective repeat. Dating: By [redacted]w[redacted]d --->  Estimated Date of Delivery: 06/13/14    Clinic [facility]  Dating Dating is by 7 week [place]  Genetic Screen 1 Screen:    declined  AFP:                    Quad:                  NIPS: normal   Anatomic [place]  normal  GTT               Third trimester: 108  TDaP vaccine 03/20/14  Flu vaccine  n/la  GBS pos  Baby Food Breast feeding  Contraception Possible vasectomy  Circumcision girl  Pediatrician [facility]  Support Person Acupuncturist (husband)     Prenatal History/Complications:  Past Medical History: Past Medical History  Diagnosis Date  . Abnormal pap     colposcopies x 2    Past Surgical History: Past Surgical History  Procedure Laterality Date  . Cesarean section      x 2  . Wisdom tooth extraction      Obstetrical History: OB History   Grav Para Term Preterm Abortions TAB SAB Ect Mult Living   3 2 2       2       Social History: History   Social History  . Marital Status: Married    Spouse Name: N/A    Number of Children: N/A  . Years of Education: N/A   Occupational History  . homemaker    Social History Main Topics  . Smoking status: Former Smoker -- 0.10 packs/day for 3 years    Types: Cigarettes  . Smokeless tobacco: Never Used  . Alcohol Use: Yes     Comment: 1 time month  . Drug Use: No  . Sexual Activity: Yes    Partners: Male    Birth Control/ Protection: IUD     Comment: Paragard 2014   Other Topics Concern  . Not on file   Social History Narrative  . No narrative on file    Family History: Family History  Problem  Relation Age of Onset  . Cancer Maternal Grandmother     pancreatic  . Heart disease Paternal Grandfather   . Heart disease Maternal Grandfather   . Heart attack Maternal Grandfather   . Stroke Paternal Grandfather     Allergies: No Known Allergies  Prescriptions prior to admission  Medication Sig Dispense Refill  . Prenatal MV-Min-Fe Fum-FA-DHA (PRENATAL 1 PO) Take by mouth daily.         Review of Systems   All systems reviewed and negative except as stated in HPI  Blood pressure 119/76, pulse 68, temperature 98.4 F (36.9 C), temperature source Oral, resp. rate 18, last menstrual period 08/30/2013. General appearance: alert, cooperative and mild distress with contractions Lungs: clear to auscultation bilaterally Heart: regular rate and rhythm Abdomen: soft, non-tender; bowel sounds normal Extremities: Homans sign is negative, no sign of DVT DTR's not examined Presentation: cephalic Fetal monitoringBaseline: 140 bpm, Variability: Good {>  6 bpm), Accelerations: Reactive and Decelerations: Absent Uterine activity q3- 4min  Dilation: 4 Effacement (%): 80 Station: -1 Exam by:: Sarajane MarekS. Carrera, RNC   Prenatal labs: ABO, Rh: A/NEG/-- (02/02 1131) Antibody: NEG (06/25 1716) Rubella:   RPR: NON REAC (06/25 1706)  HBsAg: NEGATIVE (02/02 1131)  HIV: NONREACTIVE (06/25 1706)  GBS: Positive (08/21 0000)  1 hr Glucola 108 Genetic screening  declined Anatomy US normal     No results found for this or any previous visit (from the past 24 hour(s)).  Patient Active Problem List   Diagnosis Date Noted  . GBS (group B Streptococcus carrier), +RV culture, currently pregnant 05/19/2014  . Rh negative status during pregnancy in first trimester, antepartum 10/31/2013  . History of cesarean section, low transverse x 2 10/28/2013  . Post term pregnancy, antepartum 10/28/2013  . AMA (advanced maternal age) multigravida 35+ 10/28/2013  . Spotting in pregnancy 10/28/2013    The  following risks were discussed with patient and she still desires TOLAC.  Risk of uterine rupture at term is 0.78 percent with TOLAC and 0.22 percent with ERCD. 1 in 10 uterine ruptures will result in neonatal death or neurological injury. The benefits of a trial of labor after cesarean (TOLAC) resulting in a vaginal birth after cesarean (VBAC) include the following: shorter length of hospital stay and postpartum recovery (in most cases); fewer complications, such as postpartum fever, wound or uterine infection, thromboembolism (blood clots in the leg or lung), need for blood transfusion and fewer neonatal breathing problems. The risks of an attempted VBAC or TOLAC include the following: Risk of failed trial of labor after cesarean (TOLAC) without a vaginal birth after cesarean (VBAC) resulting in repeat cesarean delivery (RCD) in about 20 to 40 percent of women who attempt VBAC.  Risk of rupture of uterus resulting in an emergency cesarean delivery. The risk of uterine rupture may be related in part to the type of uterine incision made during the first cesarean delivery. A previous transverse uterine incision has the lowest risk of rupture (0.2 to 1.5 percent risk). Vertical or T-shaped uterine incisions have a higher risk of uterine rupture (4 to 9 percent risk)The risk of fetal death is very low with both VBAC and elective repeat cesarean delivery (ERCD), but the likelihood of fetal death is higher with VBAC than with ERCD. Maternal death is very rare with either type of delivery.   Assessment: Casandra DoffingLindsay A Aguiniga is a 38 y.o. G3P2002 at [id number] here for SOL with significant history of previous LTCS x 2 with no interval VBAC who desires TOL    #Labor: no pitocin for augmentation.  TOL consent was emphasized at length with patient including the above.  She still desires trial of labor despite even high risk of uterine rupture given her surgical history.  Declines rLTCS currently.  Anesthesia has been  notified of patient and pmh. #Pain: Epidural as soon as able in birthing suites, patient advised of importance of getting epidural given high risk of uterine rupture and risk of general anesthesia to baby.   #FWB: Cat I #ID:  PCN for GBS+ #MOF: breast #MOC: ? #Circ:  n/a  Nieves Chapa ROCIO 06/24/2014, 10:26 AM

## 2014-06-24 NOTE — Progress Notes (Signed)
Patient is doing well in birthing suite, aware of her contractions and ready to for pain management with epidural. Patient with [facility] of 2 prior [facility] Risks were reviewed with the patient including risks of uterine rupture, emergency c-section, hemorrhage, increased neonatal morbidity and mortality, need for hysterectomy and maternal death. Patient verbalized understanding and wishes to proceed. Discussed with patient that labor augmentation would be performed with artificial rupture of membrane. In the presence of a protracted labor despite AROM, patient understands that pitocin will not be used and we will proceed with delivery via repeat cesarean section.

## 2014-06-24 NOTE — Anesthesia Preprocedure Evaluation (Signed)
Anesthesia Evaluation  Patient identified by MRN, date of birth, ID band Patient awake    Reviewed: Allergy & Precautions, H&P , NPO status , Patient's Chart, lab work & pertinent test results, reviewed documented beta blocker date and time   History of Anesthesia Complications Negative for: history of anesthetic complications  Airway Mallampati: I TM Distance: >3 FB Neck ROM: full    Dental  (+) Teeth Intact   Pulmonary neg pulmonary ROS, former smoker,  breath sounds clear to auscultation        Cardiovascular negative cardio ROS  Rhythm:regular Rate:Normal     Neuro/Psych negative neurological ROS  negative psych ROS   GI/Hepatic negative GI ROS, Neg liver ROS,   Endo/Other  negative endocrine ROS  Renal/GU negative Renal ROS     Musculoskeletal   Abdominal   Peds  Hematology negative hematology ROS (+)   Anesthesia Other Findings   Reproductive/Obstetrics (+) Pregnancy (h/o c/s x2, attempting VBAC)                           Anesthesia Physical Anesthesia Plan  ASA: II  Anesthesia Plan: Epidural   Post-op Pain Management:    Induction:   Airway Management Planned:   Additional Equipment:   Intra-op Plan:   Post-operative Plan:   Informed Consent: I have reviewed the patients History and Physical, chart, labs and discussed the procedure including the risks, benefits and alternatives for the proposed anesthesia with the patient or authorized representative who has indicated his/her understanding and acceptance.     Plan Discussed with:   Anesthesia Plan Comments:         Anesthesia Quick Evaluation

## 2014-06-24 NOTE — MAU Note (Signed)
Contractions started 0540, every . No bleeding or leaking. Was 1 when last checked

## 2014-06-24 NOTE — Progress Notes (Signed)
  Subjective: Pt reported increased pain with contractions 30 minutes, desired epifural.  Objective: BP 102/46  Pulse 54  Temp(Src) 98.4 F (36.9 C) (Oral)  Resp 18  Ht 5\' 2"  (1.575 m)  Wt 75.297 kg (166 lb)  BMI 30.35 kg/m2  SpO2 100%  LMP 08/30/2013      FHT:  FHR: 120's bpm, variability: moderate,  accelerations:  Present,  decelerations:  Present decel with contraction after srom with return to baseline.  UC:   regular, every 1.5-3 minutes SVE:   Dilation: 8.5 Effacement (%): 90 Station: +1 Exam by:: w. Elenora FenderKarim, cnm SROM with cervical check > clear fluid  Labs: Lab Results  Component Value Date   WBC 8.6 06/24/2014   HGB 12.7 06/24/2014   HCT 36.6 06/24/2014   MCV 95.6 06/24/2014   PLT 152 06/24/2014    Assessment / Plan: 38 yo G3P2002 at 6877w4d wks IUP TOLAC Spontaneous labor, progressing normally  Labor: Progressing normally Preeclampsia:  n/a Fetal Wellbeing:  Category II Pain Control:  Epidural I/D:  GBS pos Anticipated MOD:  NSVD  Meyers, Sabrina Webb N 06/24/2014, 2:01 PM

## 2014-06-24 NOTE — Anesthesia Procedure Notes (Signed)
Epidural Patient location during procedure: OB  Staffing Performed by: other anesthesia staff   Preanesthetic Checklist Completed: patient identified, site marked, surgical consent, pre-op evaluation, timeout performed, IV checked, risks and benefits discussed and monitors and equipment checked  Epidural Patient position: sitting Prep: site prepped and draped and DuraPrep Patient monitoring: continuous pulse ox and blood pressure Approach: midline Location: L3-L4 Injection technique: LOR air  Needle:  Needle type: Tuohy  Needle gauge: 17 G Needle length: 9 cm and 9 Needle insertion depth: 5.5 cm Catheter type: closed end flexible Catheter size: 19 Gauge Catheter at skin depth: 10.5 cm Test dose: negative  Assessment Events: blood not aspirated, injection not painful, no injection resistance, negative IV test and no paresthesia  Additional Notes Discussed risk of headache, infection, bleeding, nerve injury and failed or incomplete block.  Patient voices understanding and wishes to proceed.  Epidural placed by SRNA on second attempt.  First attempt was 2 levels above (skin was numbed and initial attempt made to seat tuohy, but then patient repositioned and it was noted that this was incorrect level).  Second attempt was made at L3-4 atraumatically.  No paresthesia.  Patient tolerated procedure well with no apparent complications.  Entire procedure under my direct supervision.  Jasmine DecemberA. Cameran Pettey, MDReason for block:procedure for pain

## 2014-06-25 MED ORDER — RHO D IMMUNE GLOBULIN 1500 UNIT/2ML IJ SOSY
300.0000 ug | PREFILLED_SYRINGE | Freq: Once | INTRAMUSCULAR | Status: AC
  Administered 2014-06-25: 300 ug via INTRAMUSCULAR
  Filled 2014-06-25: qty 2

## 2014-06-25 NOTE — H&P (Signed)
Attestation of Attending Supervision of Advanced Practitioner (CNM/NP): Evaluation and management procedures were performed by the Advanced Practitioner under my supervision and collaboration.  I have reviewed the Advanced Practitioner's note and chart, and I agree with the management and plan.  Caldonia Leap 06/25/2014 8:04 AM

## 2014-06-25 NOTE — Anesthesia Postprocedure Evaluation (Signed)
  Anesthesia Post-op Note  Patient: Sabrina Meyers  Procedure(s) Performed: * No procedures listed *  Patient Location: Mother/Baby  Anesthesia Type:Epidural  Level of Consciousness: awake, alert , oriented and patient cooperative  Airway and Oxygen Therapy: Patient Spontanous Breathing  Post-op Pain: none  Post-op Assessment: Post-op Vital signs reviewed, Patient's Cardiovascular Status Stable, Respiratory Function Stable, Patent Airway, No signs of Nausea or vomiting, Adequate PO intake, Pain level controlled, No headache, No backache, No residual numbness and No residual motor weakness  Post-op Vital Signs: Reviewed and stable  Last Vitals:  Filed Vitals:   06/25/14 0630  BP: 110/81  Pulse: 73  Temp: 36.7 C  Resp: 18    Complications: No apparent anesthesia complications

## 2014-06-25 NOTE — Progress Notes (Signed)
Post Partum Day 1, GBS+, vaginal birth after 2 previous c/s Subjective:  Sabrina Meyers is a 38 y.o. [id number] 6747w4d s/p VBAC.  No acute events overnight.  Pt denies problems with ambulating, voiding or po intake.  She denies nausea or vomiting.  Pain is well controlled.   Plan for birth control is ?vasectomy.  Method of Feeding: breast  Objective: Blood pressure 118/69, pulse 64, temperature 97.9 F (36.6 C), temperature source Oral, resp. rate 18, height 5\' 2"  (1.575 m), weight 166 lb (75.297 kg), last menstrual period 08/30/2013, SpO2 100.00%, unknown if currently breastfeeding.  Physical Exam:  General: alert, cooperative and no distress Lochia:normal flow Chest: CTAB Heart: RRR no m/r/g Abdomen: +BS, soft, nontender,  Uterine Fundus: firm DVT Evaluation: No evidence of DVT seen on physical exam. Extremities: no edema   Recent Labs  06/24/14 1006  HGB 12.7  HCT 36.6    Assessment/Plan:  ASSESSMENT: Sabrina Meyers is a 38 y.o. E3P2951G3P3003 5847w4d s/p VBAC (2 previous c/s), GBS+  Plan for discharge tomorrow   LOS: 1 day   Sabrina Meyers ROCIO 06/25/2014, 5:56 AM

## 2014-06-25 NOTE — Discharge Instructions (Signed)

## 2014-06-25 NOTE — Discharge Summary (Signed)
  Subjective:  Sabrina Meyers is a 38 y.o. [phone] 3232w4d s/p VBAC. No acute events overnight. Pt denies problems with ambulating, voiding or po intake. She denies nausea or vomiting. Pain is well controlled. Plan for birth control is ?vasectomy. Method of Feeding: breast. Baby has been DC by peds, and patient is requesting DC home. No concerns at this time.  Objective:  Blood pressure 118/69, pulse 64, temperature 97.9 F (36.6 C), temperature source Oral, resp. rate 18, height 5\' 2"  (1.575 m), weight 166 lb (75.297 kg), last menstrual period 08/30/2013, SpO2 100.00%, unknown if currently breastfeeding.  Physical Exam:  General: alert, cooperative and no distress  Lochia:normal flow  Chest: CTAB  Heart: RRR no m/r/g  Abdomen: +BS, soft, nontender,  Uterine Fundus: firm  DVT Evaluation: No evidence of DVT seen on physical exam.  Extremities: no edema  Recent Labs   06/24/14 1006   HGB  12.7   HCT  36.6   Assessment/Plan:  ASSESSMENT: Sabrina Meyers is a 38 y.o. [id number] 1332w4d s/p VBAC (2 previous c/s), GBS+  D/C Routine care Pelvic rest Activity as tolerated FU with the office in 6 weeks Baby DC home with the mother  Tawnya CrookHogan, Heather Donovan

## 2014-06-25 NOTE — Lactation Note (Signed)
This note was copied from the chart of Sabrina Donnal MoatLindsay Hand. Lactation Consultation Note  Patient Name: Sabrina Meyers ZOXWR'UToday's Date: 06/25/2014  Baby 7 hours of life. Baby being bathed first time LC entered room, then being weighed the second time. Mom reports nursing going well. Mom states that she nursed her second child--baby had tight labial frenulum, mom had a lot of pain, stopped nursing, but then re-lactated. Enc mom to ask for assistance if she feels any pinching or pain with this baby. Baby not cueing to feed now. Both mom and patient's MBU RN Jan state that latch and nursing going well. Discussed with mom that nursing should not be painful. Enc mom to offer lots of STS, nurse with cues and at least 8-12 times/24 hours. [facility], aware of OP/BFSG and community resources. Enc mom to call out for assistance as needed.    Maternal Data    Feeding Feeding Type: Breast Fed Length of feed: 10 min  LATCH Score/Interventions                      Lactation Tools Discussed/Used     Consult Status      Lashona Schaaf 06/25/2014, 12:00 AM

## 2014-06-26 ENCOUNTER — Inpatient Hospital Stay (HOSPITAL_COMMUNITY): Admission: RE | Admit: 2014-06-26 | Payer: 59 | Source: Ambulatory Visit

## 2014-06-26 ENCOUNTER — Inpatient Hospital Stay (HOSPITAL_COMMUNITY): Admission: AD | Admit: 2014-06-26 | Payer: 59 | Source: Ambulatory Visit | Admitting: Family Medicine

## 2014-06-26 LAB — RH IG WORKUP (INCLUDES ABO/RH)
ABO/RH(D): A NEG
FETAL SCREEN: NEGATIVE
Gestational Age(Wks): 41
Unit division: 0

## 2014-06-26 SURGERY — Surgical Case
Anesthesia: Regional | Site: Abdomen

## 2014-07-28 ENCOUNTER — Encounter (HOSPITAL_COMMUNITY): Payer: Self-pay | Admitting: *Deleted

## 2014-08-04 ENCOUNTER — Ambulatory Visit (INDEPENDENT_AMBULATORY_CARE_PROVIDER_SITE_OTHER): Payer: 59 | Admitting: Advanced Practice Midwife

## 2014-08-04 ENCOUNTER — Encounter: Payer: Self-pay | Admitting: Advanced Practice Midwife

## 2014-08-04 VITALS — BP 126/73 | HR 79 | Resp 16 | Ht 64.0 in | Wt 139.0 lb

## 2014-08-04 DIAGNOSIS — IMO0001 Reserved for inherently not codable concepts without codable children: Secondary | ICD-10-CM

## 2014-08-04 NOTE — Patient Instructions (Signed)

## 2014-08-04 NOTE — Progress Notes (Signed)
  Subjective:     Sabrina Meyers is a 38 y.o. female who presents for a postpartum visit. She is 5 weeks postpartum following a spontaneous vaginal delivery. I have fully reviewed the prenatal and intrapartum course. The delivery was at term. Outcome: spontaneous vaginal delivery. Anesthesia: none. Postpartum course has been uneventful. Baby's course has been uneventful. Baby is feeding by breast. Bleeding no bleeding. Bowel function is normal. Bladder function is normal. Patient is not sexually active. Contraception method is vasectomy. Postpartum depression screening: negative with score of 0  The following portions of the patient's history were reviewed and updated as appropriate: allergies, current medications, past family history, past medical history, past social history, past surgical history and problem list.  Review of Systems Pertinent items are noted in HPI.   Objective:    BP 126/73 mmHg  Pulse 79  Resp 16  Ht 5\' 4"  (1.626 m)  Wt 139 lb (63.05 kg)  BMI 23.85 kg/m2  Breastfeeding? Yes  General:  alert, cooperative and no distress   Breasts:  inspection negative, no nipple discharge or bleeding, no masses or nodularity palpable  Lungs: clear to auscultation bilaterally  Heart:  regular rate and rhythm, S1, S2 normal, no murmur, click, rub or gallop  Abdomen: soft, non-tender; bowel sounds normal; no masses,  no organomegaly   Vulva:  normal and sutures well healed  Vagina: normal vagina, no discharge, exudate, lesion, or erythema  Cervix:  no lesions  Corpus: normal size, contour, position, consistency, mobility, non-tender  Adnexa:  no mass, fullness, tenderness  Rectal Exam: Not performed.        Assessment:     normal postpartum exam. Pap smear not done at today's visit.   Plan:    1. Contraception: vasectomy 2. Call if needed 3. Follow up in: 1 year or as needed.

## 2014-08-24 IMAGING — US US OB DETAIL+14 WK
1 series · 12 of 28 positions shown · non-contrast
Comparison: none

[Series 1: us ob detail +14 wk · 87 acquisitions, 12 frames shown]
[im 4/87]
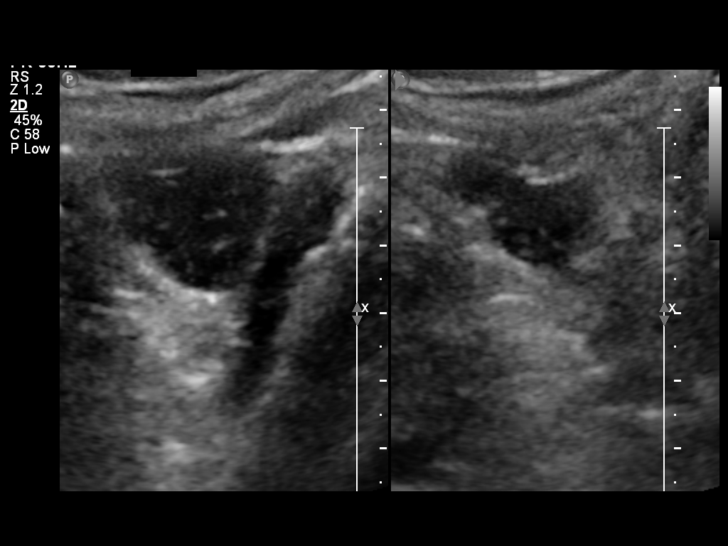
[im 10/87]
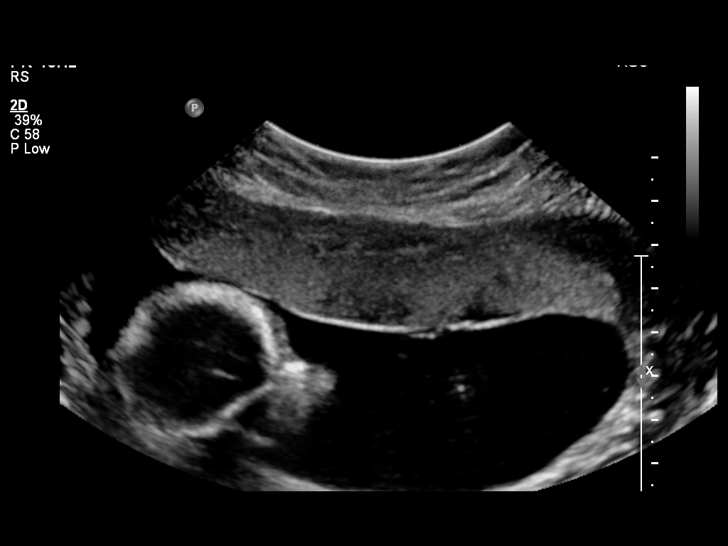
[im 16/87]
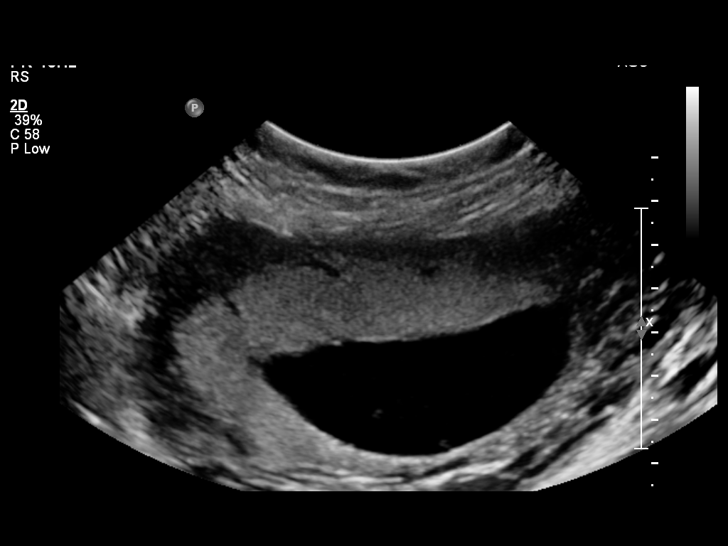
[im 26/87]
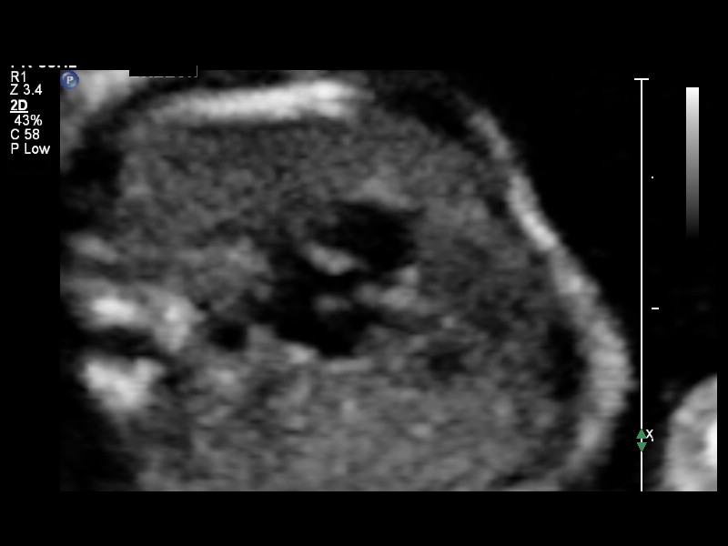
[im 32/87]
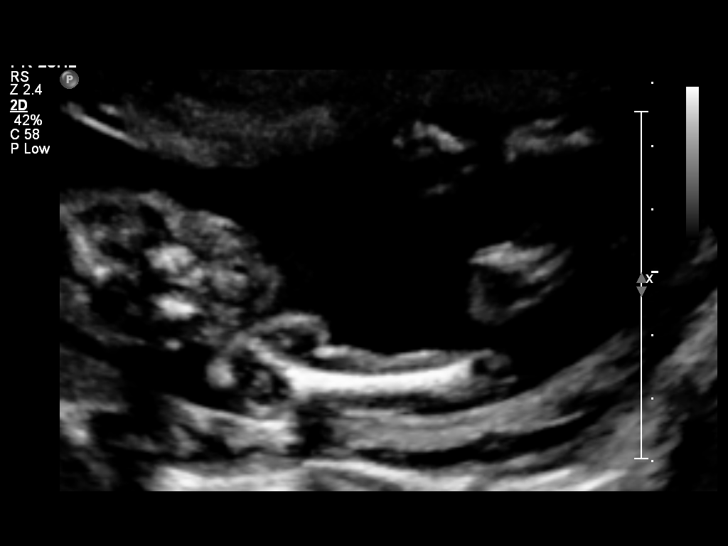
[im 39/87]
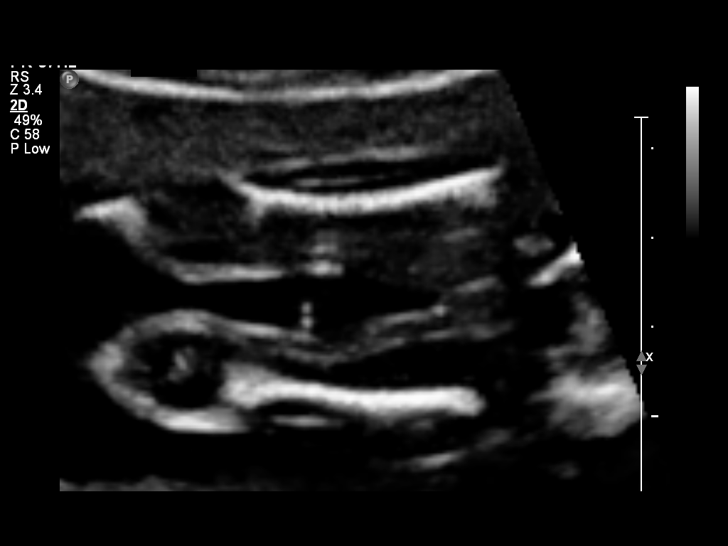
[im 48/87]
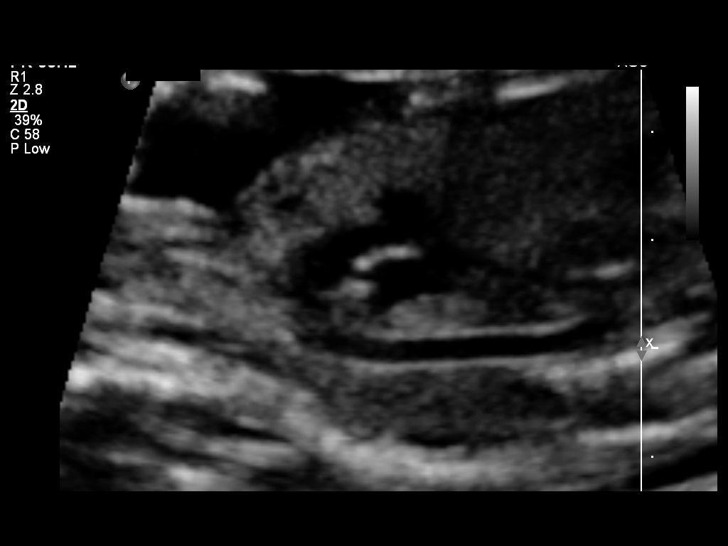
[im 55/87]
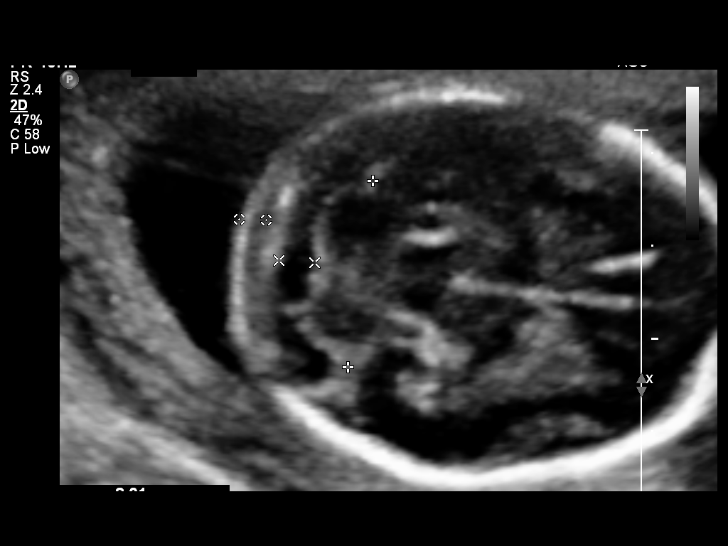
[im 61/87]
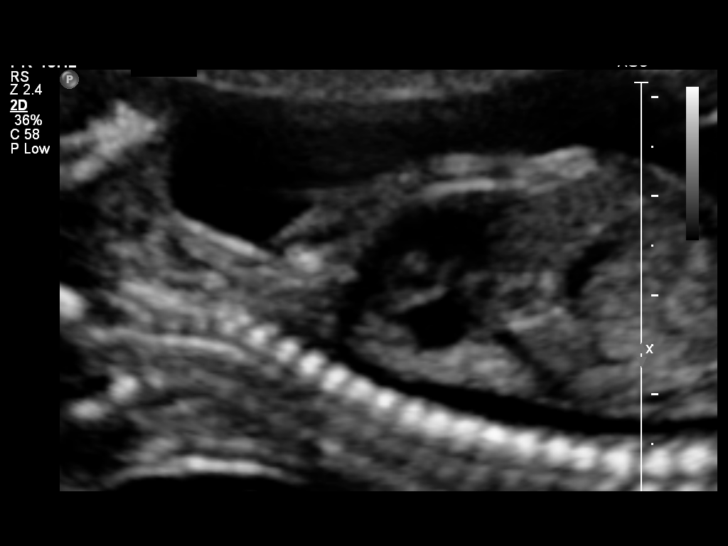
[im 71/87]
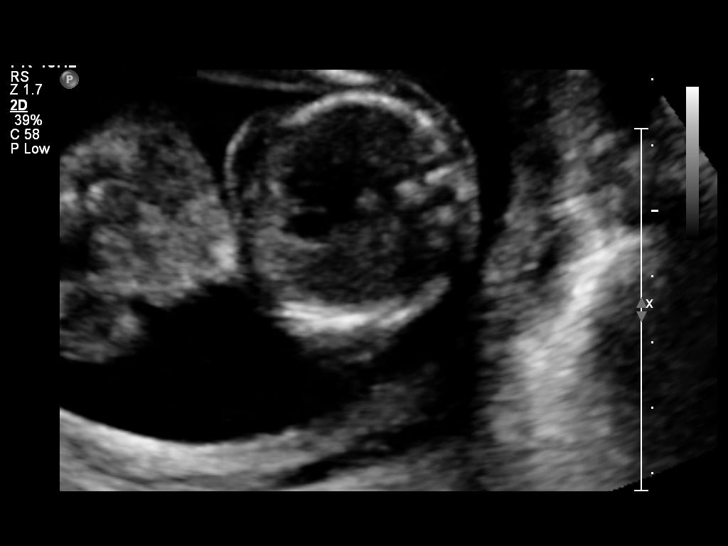
[im 77/87]
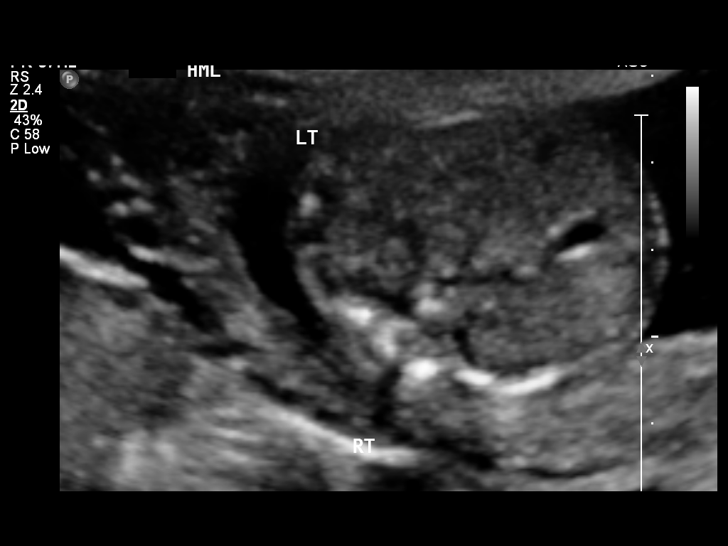
[im 83/87]
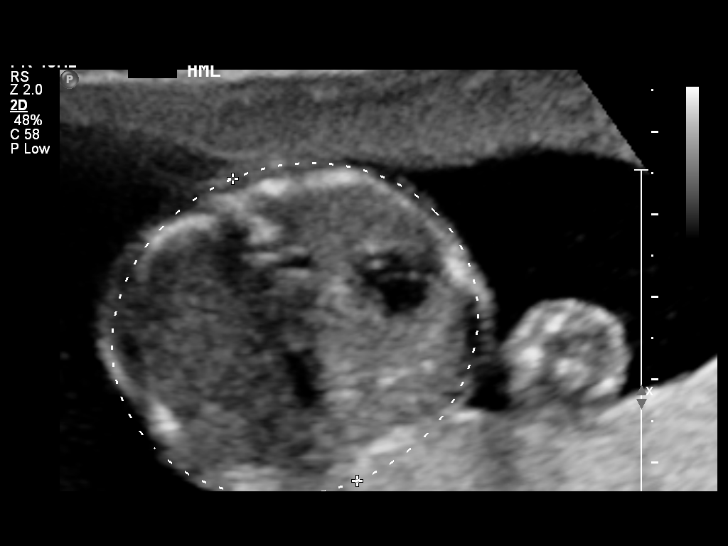

[12 of 28 positions shown; findings below may reference images not displayed]

OBSTETRICS REPORT
                      (Signed Final 01/17/2014 [DATE])

 Name:       LORENZ JUMPER                 Visit Date: 01/17/2014 [DATE]

Service(s) Provided

 US OB DETAIL + 14 WK                                  76811.0
Indications

 Detailed fetal anatomic survey
 Advanced maternal age (AMA)
Fetal Evaluation

 Num Of Fetuses:    1
 Fetal Heart Rate:  140                          bpm
 Cardiac Activity:  Observed
 Presentation:      Variable
 Placenta:          Anterior, above cervical os
 P. Cord            Visualized, central
 Insertion:

 Amniotic Fluid
 AFI FV:      Subjectively within normal limits
                                              Larg Pckt:    4.0  cm
Biometry

 BPD:     40.4  mm     G. Age:  18w 2d                CI:        65.95   70 - 86
                                                      FL/HC:       17.5  16.1 -

 HC:     159.6  mm     G. Age:  18w 6d       33   %   HC/AC:       1.19  1.09 -

 AC:     134.1  mm     G. Age:  18w 6d       42   %   FL/BPD:
 FL:      27.9  mm     G. Age:  18w 4d       27   %   FL/AC:       20.8  20 - 24
 HUM:     27.6  mm     G. Age:  18w 6d       46   %
 CER:     20.1  mm     G. Age:  19w 1d       52   %
 NFT:       2.9 mm
 Est. FW:     254   gm     0 lb 9 oz     39  %
Gestational Age

 U/S Today:     18w 5d                                        EDD:   06/15/14
 Best:          19w 0d     Det. By:  Early Ultrasound         EDD:   06/13/14
                                     (10/29/13)
Anatomy
 Cranium:          Appears normal         Aortic Arch:      Appears normal
 Fetal Cavum:      Appears normal         Ductal Arch:      Appears normal
 Ventricles:       Appears normal         Diaphragm:        Appears normal
 Choroid Plexus:   Appears normal         Stomach:          Appears normal, left
                                                            sided
 Cerebellum:       Appears normal         Abdomen:          Appears normal
 Posterior Fossa:  Appears normal         Abdominal Wall:   Appears nml (cord
                                                            insert, abd wall)
 Nuchal Fold:      Appears normal         Cord Vessels:     Appears normal (3
                                                            vessel cord)
 Face:             Appears normal         Kidneys:          Appear normal
                   (orbits and profile)
 Lips:             Appears normal         Bladder:          Appears normal
 Heart:            Appears normal         Spine:            Appears normal
                   (4CH, axis, and
                   situs)
 RVOT:             Appears normal         Lower             Appears normal
                                          Extremities:
 LVOT:             Appears normal         Upper             Appears normal
                                          Extremities:

 Other:  Fetus appears to be a female. Technically difficult due to fetal
         position. Heels and 5th digit appear normal.
Targeted Anatomy

 Fetal Central Nervous System
 Lat. Ventricles:  6.0                    Cisterna Magna:
Cervix Uterus Adnexa

 Cervical Length:    3.27      cm

 Cervix:       Normal appearance by transabdominal scan.
 Uterus:       No abnormality visualized.

 Left Ovary:    Size(cm) L: 2.26 x W: 1.7 x H: 1.89  Volume(cc):
 Right Ovary:   Size(cm) L: 2.5 x W: 1.57 x H: 1.43  Volume(cc):
 Adnexa:     No abnormality visualized. No adnexal mass visualized.
Impression

 Single IUP at 19 0/7 weeks
 Normal fetal anatomic survey
 No markers associated with aneuploidy were appreciated
 Anterior placenta without previa
 Normal amniotic fluid volume
Recommendations

 Follow-up ultrasounds as clinically indicated.

 questions or concerns.

## 2018-03-09 NOTE — Telephone Encounter (Signed)
Preadmission screen  

## 2022-11-03 ENCOUNTER — Other Ambulatory Visit (HOSPITAL_COMMUNITY)
Admission: RE | Admit: 2022-11-03 | Discharge: 2022-11-03 | Disposition: A | Discharge status: Home / Self Care | Payer: BC Managed Care – PPO | Source: Ambulatory Visit | Attending: Obstetrics and Gynecology | Admitting: Obstetrics and Gynecology

## 2022-11-03 ENCOUNTER — Encounter: Payer: Self-pay | Admitting: Obstetrics and Gynecology

## 2022-11-03 ENCOUNTER — Ambulatory Visit (INDEPENDENT_AMBULATORY_CARE_PROVIDER_SITE_OTHER): Payer: BC Managed Care – PPO | Admitting: Obstetrics and Gynecology

## 2022-11-03 VITALS — BP 114/68 | HR 62 | Resp 16 | Ht 64.0 in | Wt 144.0 lb

## 2022-11-03 DIAGNOSIS — Z1211 Encounter for screening for malignant neoplasm of colon: Secondary | ICD-10-CM

## 2022-11-03 DIAGNOSIS — Z01419 Encounter for gynecological examination (general) (routine) without abnormal findings: Secondary | ICD-10-CM

## 2022-11-03 DIAGNOSIS — Z1159 Encounter for screening for other viral diseases: Secondary | ICD-10-CM

## 2022-11-03 NOTE — Progress Notes (Signed)
   ANNUAL EXAM Patient name: Sabrina Meyers MRN [id number]  Date of birth: [birth year] Chief Complaint:   Annual Exam  History of Present Illness:   Sabrina Meyers is a 47 y.o. [phone] with Patient's last menstrual period was 10/06/2022. being seen today for a routine annual exam.  Current complaints: None   The pregnancy intention screening data noted above was reviewed. Potential methods of contraception were discussed. The patient elected to proceed with Vasectomy.   Last pap 2015. Results were: negative per pt report at   . H/O abnormal pap: no - maybe abnormal > 10 years ago but no procedures needed Last mammogram: 10/20/22. Results were: normal. Family h/o breast cancer: yes mother with hereditary breast ca, pt has already been tested and was negative for any known mutations Last colonoscopy: n/a      No data to display             No data to display         Review of Systems:   Pertinent items are noted in HPI Denies any headaches, blurred vision, fatigue, shortness of breath, chest pain, abdominal pain, abnormal vaginal discharge/itching/odor/irritation, problems with periods, bowel movements, urination, or intercourse unless otherwise stated above. Pertinent History Reviewed:  Reviewed past medical,surgical, social and family history.  Reviewed problem list, medications and allergies. Physical Assessment:   Vitals:   11/03/22 0902  BP: 114/68  Pulse: 62  Resp: 16  Weight: 144 lb (65.3 kg)  Height: 5\' 4"  (1.626 m)  Body mass index is 24.72 kg/m.        Physical Examination:   General appearance - well appearing, and in no distress  Mental status - alert, oriented to person, place, and time  Chest - respiratory effort normal  Heart - normal peripheral perfusion  Breasts - breasts appear normal, no suspicious masses, no skin or nipple changes or axillary nodes  Abdomen - soft, nontender, nondistended, no masses or organomegaly  Pelvic - VULVA: normal  appearing vulva with no masses, tenderness or lesions  VAGINA: normal appearing vagina with normal color and discharge, no lesions  CERVIX: normal appearing cervix without discharge or lesions, no CMT  Thin prep pap is done with HR HPV cotesting  UTERUS: uterus is felt to be normal size, shape, consistency and nontender   ADNEXA: No adnexal masses or tenderness noted.  Chaperone present for exam  No results found for this or any previous visit (from the past 24 hour(s)).  Assessment & Plan:  1) Well-Woman Exam Mammogram: in 1 year, or sooner if problems Colonoscopy: @ 45yo, or sooner if problems - referral to GI placed for CSY later this year Pap: Collected GC/CT: declined HIV/HCV: HCV ordered, HIV declined (previously negative)  Labs/procedures today:   Orders Placed This Encounter  Procedures   MM DIGITAL SCREENING BILATERAL   Hepatitis C Antibody   Ambulatory referral to Gastroenterology   Meds: No orders of the defined types were placed in this encounter.  Follow-up: Return in about 1 year (around 11/04/2023) for annual exam.  Inez Catalina, MD 11/03/2022 10:26 AM

## 2022-11-04 LAB — HEPATITIS C ANTIBODY: Hep C Virus Ab: NONREACTIVE

## 2022-11-07 LAB — CYTOLOGY - PAP
Comment: NEGATIVE
Diagnosis: NEGATIVE
High risk HPV: NEGATIVE
